# Patient Record
Sex: Male | Born: 1935 | Race: White | Hispanic: No | Marital: Single | Smoking: Never smoker
Health system: Southern US, Community
[De-identification: ages and names within clinical notes are randomized; demographics above are authoritative.]

## PROBLEM LIST (undated history)

## (undated) DIAGNOSIS — E875 Hyperkalemia: Secondary | ICD-10-CM

## (undated) DIAGNOSIS — D649 Anemia, unspecified: Secondary | ICD-10-CM

## (undated) DIAGNOSIS — N186 End stage renal disease: Secondary | ICD-10-CM

## (undated) DIAGNOSIS — F0390 Unspecified dementia without behavioral disturbance: Secondary | ICD-10-CM

---

## 2015-02-23 ENCOUNTER — Inpatient Hospital Stay
Admission: EM | Admit: 2015-02-23 | Discharge: 2015-02-25 | DRG: 682 | Disposition: A | Discharge status: Home / Self Care | Payer: Medicare Other | Attending: Internal Medicine | Admitting: Internal Medicine

## 2015-02-23 ENCOUNTER — Emergency Department: Payer: Medicare Other

## 2015-02-23 DIAGNOSIS — N184 Chronic kidney disease, stage 4 (severe): Secondary | ICD-10-CM | POA: Diagnosis present

## 2015-02-23 DIAGNOSIS — Z66 Do not resuscitate: Secondary | ICD-10-CM | POA: Diagnosis present

## 2015-02-23 DIAGNOSIS — Z79899 Other long term (current) drug therapy: Secondary | ICD-10-CM | POA: Diagnosis not present

## 2015-02-23 DIAGNOSIS — G459 Transient cerebral ischemic attack, unspecified: Secondary | ICD-10-CM | POA: Diagnosis present

## 2015-02-23 DIAGNOSIS — D649 Anemia, unspecified: Secondary | ICD-10-CM

## 2015-02-23 DIAGNOSIS — F0390 Unspecified dementia without behavioral disturbance: Secondary | ICD-10-CM | POA: Diagnosis present

## 2015-02-23 DIAGNOSIS — N179 Acute kidney failure, unspecified: Principal | ICD-10-CM | POA: Diagnosis present

## 2015-02-23 DIAGNOSIS — N281 Cyst of kidney, acquired: Secondary | ICD-10-CM | POA: Diagnosis present

## 2015-02-23 DIAGNOSIS — I10 Essential (primary) hypertension: Secondary | ICD-10-CM

## 2015-02-23 DIAGNOSIS — J811 Chronic pulmonary edema: Secondary | ICD-10-CM | POA: Diagnosis present

## 2015-02-23 DIAGNOSIS — E875 Hyperkalemia: Secondary | ICD-10-CM | POA: Diagnosis present

## 2015-02-23 DIAGNOSIS — H3530 Unspecified macular degeneration: Secondary | ICD-10-CM | POA: Diagnosis present

## 2015-02-23 DIAGNOSIS — E8770 Fluid overload, unspecified: Secondary | ICD-10-CM | POA: Diagnosis present

## 2015-02-23 DIAGNOSIS — J810 Acute pulmonary edema: Secondary | ICD-10-CM | POA: Diagnosis present

## 2015-02-23 DIAGNOSIS — I340 Nonrheumatic mitral (valve) insufficiency: Secondary | ICD-10-CM | POA: Diagnosis not present

## 2015-02-23 DIAGNOSIS — R739 Hyperglycemia, unspecified: Secondary | ICD-10-CM | POA: Diagnosis present

## 2015-02-23 DIAGNOSIS — J90 Pleural effusion, not elsewhere classified: Secondary | ICD-10-CM | POA: Diagnosis present

## 2015-02-23 DIAGNOSIS — I1310 Hypertensive heart and chronic kidney disease without heart failure, with stage 1 through stage 4 chronic kidney disease, or unspecified chronic kidney disease: Secondary | ICD-10-CM | POA: Diagnosis present

## 2015-02-23 DIAGNOSIS — Z8249 Family history of ischemic heart disease and other diseases of the circulatory system: Secondary | ICD-10-CM

## 2015-02-23 LAB — URINALYSIS COMPLETE WITH MICROSCOPIC (ARMC ONLY)
BACTERIA UA: NONE SEEN
Bilirubin Urine: NEGATIVE
Glucose, UA: NEGATIVE mg/dL
Hgb urine dipstick: NEGATIVE
KETONES UR: NEGATIVE mg/dL
Leukocytes, UA: NEGATIVE
Nitrite: NEGATIVE
PROTEIN: 30 mg/dL — AB
Specific Gravity, Urine: 1.013 (ref 1.005–1.030)
pH: 7 (ref 5.0–8.0)

## 2015-02-23 LAB — COMPREHENSIVE METABOLIC PANEL
ALK PHOS: 57 U/L (ref 38–126)
ALT: 9 U/L — AB (ref 17–63)
AST: 15 U/L (ref 15–41)
Albumin: 3.6 g/dL (ref 3.5–5.0)
Anion gap: 7 (ref 5–15)
BILIRUBIN TOTAL: 0.4 mg/dL (ref 0.3–1.2)
BUN: 38 mg/dL — ABNORMAL HIGH (ref 6–20)
CALCIUM: 8.3 mg/dL — AB (ref 8.9–10.3)
CO2: 23 mmol/L (ref 22–32)
CREATININE: 2.35 mg/dL — AB (ref 0.61–1.24)
Chloride: 111 mmol/L (ref 101–111)
GFR, EST AFRICAN AMERICAN: 25 mL/min — AB (ref >60)
GFR, EST NON AFRICAN AMERICAN: 22 mL/min — AB (ref >60)
Glucose, Bld: 100 mg/dL — ABNORMAL HIGH (ref 65–99)
Potassium: 5.5 mmol/L — ABNORMAL HIGH (ref 3.5–5.1)
Sodium: 141 mmol/L (ref 135–145)
TOTAL PROTEIN: 5.8 g/dL — AB (ref 6.5–8.1)

## 2015-02-23 LAB — LACTIC ACID, PLASMA
Lactic Acid, Venous: 1.1 mmol/L (ref 0.5–2.0)
Lactic Acid, Venous: 0.7 mmol/L (ref 0.5–2.0)

## 2015-02-23 LAB — CBC
HCT: 30.3 % — ABNORMAL LOW (ref 40.0–52.0)
Hemoglobin: 10 g/dL — ABNORMAL LOW (ref 13.0–18.0)
MCH: 33.1 pg (ref 26.0–34.0)
MCHC: 33.1 g/dL (ref 32.0–36.0)
MCV: 99.9 fL (ref 80.0–100.0)
PLATELETS: 124 10*3/uL — AB (ref 150–440)
RBC: 3.03 MIL/uL — AB (ref 4.40–5.90)
RDW: 15.4 % — ABNORMAL HIGH (ref 11.5–14.5)
WBC: 6 10*3/uL (ref 3.8–10.6)

## 2015-02-23 LAB — TROPONIN I

## 2015-02-23 MED ORDER — ACETAMINOPHEN 650 MG RE SUPP: 650.0000 mg | Freq: Four times a day (QID) | RECTAL | Status: DC | PRN

## 2015-02-23 MED ORDER — ALUM & MAG HYDROXIDE-SIMETH 200-200-20 MG/5ML PO SUSP: 30.0000 mL | Freq: Four times a day (QID) | ORAL | Status: DC | PRN

## 2015-02-23 MED ORDER — SODIUM CHLORIDE 0.9 % IV SOLN
INTRAVENOUS | Status: DC
  Administered 2015-02-23: 22:00:00 via INTRAVENOUS

## 2015-02-23 MED ORDER — ACETAMINOPHEN 325 MG PO TABS: 650.0000 mg | ORAL_TABLET | Freq: Four times a day (QID) | ORAL | Status: DC | PRN

## 2015-02-23 MED ORDER — ENOXAPARIN SODIUM 40 MG/0.4ML ~~LOC~~ SOLN: 40.0000 mg | SUBCUTANEOUS | Status: DC

## 2015-02-23 MED ORDER — SENNOSIDES-DOCUSATE SODIUM 8.6-50 MG PO TABS: 1.0000 | ORAL_TABLET | Freq: Every evening | ORAL | Status: DC | PRN

## 2015-02-23 MED ORDER — HEPARIN SODIUM (PORCINE) 5000 UNIT/ML IJ SOLN
5000.0000 [IU] | Freq: Two times a day (BID) | INTRAMUSCULAR | Status: DC
  Administered 2015-02-23 – 2015-02-25 (×4): 5000 [IU] via SUBCUTANEOUS
  Filled 2015-02-23 (×4): qty 1

## 2015-02-23 MED ORDER — OCUVITE-LUTEIN PO CAPS
1.0000 | ORAL_CAPSULE | Freq: Two times a day (BID) | ORAL | Status: DC
  Administered 2015-02-23 – 2015-02-25 (×4): 1 via ORAL
  Filled 2015-02-23 (×4): qty 1

## 2015-02-23 MED ORDER — GLUCOSAMINE-CHONDROITIN 500-400 MG PO TABS: 1.0000 | ORAL_TABLET | Freq: Every day | ORAL | Status: DC

## 2015-02-23 MED ORDER — DIPHENHYDRAMINE HCL 12.5 MG/5ML PO ELIX: 50.0000 mg | ORAL_SOLUTION | Freq: Once | ORAL | Status: DC

## 2015-02-23 NOTE — ED Provider Notes (Signed)
[facility] Emergency Department Provider Note  ____________________________________________  Time seen: Approximately 5:06 PM  I have reviewed the triage vital signs and the nursing notes.   HISTORY  Chief Complaint Altered Mental Status   HPI Candis SchatzJohn Piontek is a 81 y.o. male this afternoon patient had an episode of slurry speech and words that were not making sense. This happened between 1 and 3:00. The symptoms have completely resolved at the present time. Speech was slurry enough to be unintelligible making it severe slurry speech. Patient had no new surgery medicines or activities. Nothing made it better or worse.  History reviewed. No pertinent past medical history.  Patient has no past medical history  There are no active problems to display for this patient.   History reviewed. No pertinent past surgical history.   Patient's only surgical history significant for shrapnel wound sustained to the right leg and the second one more in the [place]  Current Outpatient Rx  Name  Route  Sig  Dispense  Refill  . glucosamine-chondroitin 500-400 MG tablet   Oral   Take 1 tablet by mouth daily.         . Multiple Vitamins-Minerals (PRESERVISION AREDS 2) CAPS   Oral   Take 1 capsule by mouth 2 (two) times daily.           Allergies Review of patient's allergies indicates no known allergies.  No family history on file.  Social History Social History  Substance Use Topics  . Smoking status: Never Smoker   . Smokeless tobacco: None  . Alcohol Use: None   patient actually reports he was a smoker 40 years ago. He currently smokes a pipe but does not inhale Review of Systems Constitutional: No fever/chills Eyes: No visual changes. ENT: No sore throat. Cardiovascular: Denies chest pain. Respiratory: Denies shortness of breath. Gastrointestinal: No abdominal pain.  No nausea, no vomiting.  No diarrhea.  No constipation. Genitourinary: Negative for  dysuria. Musculoskeletal: Negative for back pain. Skin: Negative for rash. Neurological: Negative for headaches, 10-point ROS otherwise negative.  ____________________________________________   PHYSICAL EXAM:  VITAL SIGNS: ED Triage Vitals  Enc Vitals Group     BP 02/23/15 1420 152/76 mmHg     Pulse Rate 02/23/15 1420 64     Resp 02/23/15 1420 16     Temp 02/23/15 1420 98.4 F (36.9 C)     Temp Source 02/23/15 1420 Oral     SpO2 02/23/15 1420 98 %     Weight --      Height --      Head Cir --      Peak Flow --      Pain Score 02/23/15 1448 0     Pain Loc --      Pain Edu? --      Excl. in GC? --     Constitutional: Alert and oriented. Well appearing and in no acute distress. Eyes: Conjunctivae are normal. PERRL. EOMI. Head: Atraumatic. Nose: No congestion/rhinnorhea. Mouth/Throat: Mucous membranes are moist.  Oropharynx non-erythematous. Neck: No stridor.  Cardiovascular: Normal rate, regular rhythm. Grossly normal heart sounds.  Good peripheral circulation. Respiratory: Normal respiratory effort.  No retractions. Lungs CTAB. Gastrointestinal: Soft and nontender. No distention. No abdominal bruits. No CVA tenderness. Musculoskeletal: No lower extremity tenderness nor edema.  No joint effusions. Neurologic:  Normal speech and language. No gross focal neurologic deficits are appreciated. Cranial nerves II through XII are intact cerebellar finger-nose rapid alternating movements and hands are normal  motor strength is 5 over 5 throughout sensation is intact. Skin:  Skin is warm, dry and intact. No rash noted. Psychiatric: Mood and affect are normal. Speech and behavior are normal.  ____________________________________________   LABS (all labs ordered are listed, but only abnormal results are displayed)  Labs Reviewed  COMPREHENSIVE METABOLIC PANEL - Abnormal; Notable for the following:    Potassium 5.5 (*)    Glucose, Bld 100 (*)    BUN 38 (*)    Creatinine, Ser  2.35 (*)    Calcium 8.3 (*)    Total Protein 5.8 (*)    ALT 9 (*)    GFR calc non Af Amer 22 (*)    GFR calc Af Amer 25 (*)    All other components within normal limits  CBC - Abnormal; Notable for the following:    RBC 3.03 (*)    Hemoglobin 10.0 (*)    HCT 30.3 (*)    RDW 15.4 (*)    Platelets 124 (*)    All other components within normal limits  URINALYSIS COMPLETEWITH MICROSCOPIC ([facility] ONLY) - Abnormal; Notable for the following:    Color, Urine YELLOW (*)    APPearance CLEAR (*)    Protein, ur 30 (*)    Squamous Epithelial / LPF 0-5 (*)    All other components within normal limits  TROPONIN I  LACTIC ACID, PLASMA  LACTIC ACID, PLASMA  CBC WITH DIFFERENTIAL/PLATELET  CBG MONITORING, ED   ____________________________________________  EKG  EKG shows normal sinus rhythm at a rate of 63 left axis left anterior hemiblock nonspecific ST-T wave changes with T-wave inversions in the lateral chest leads ____________________________________________  RADIOLOGY  CT showed no acute disease per radiology ____________________________________________   PROCEDURES   ____________________________________________   INITIAL IMPRESSION / ASSESSMENT AND PLAN / ED COURSE  Pertinent labs & imaging results that were available during my care of the patient were reviewed by me and considered in my medical decision making (see chart for details).   ____________________________________________   FINAL CLINICAL IMPRESSION(S) / ED DIAGNOSES  Final diagnoses:  Transient cerebral ischemia, unspecified transient cerebral ischemia type      Arnaldo Natal, MD 02/23/15 [phone]

## 2015-02-23 NOTE — Progress Notes (Signed)
PHARMACIST - PHYSICIAN ORDER COMMUNICATION  CONCERNING: P&T Medication Policy on Herbal Medications  DESCRIPTION:  This patient's order for:  Glucosamine-Chondroitin  has been noted.  This product(s) is classified as an "herbal" or natural product. Due to a lack of definitive safety studies or FDA approval, nonstandard manufacturing practices, plus the potential risk of unknown drug-drug interactions while on inpatient medications, the Pharmacy and Therapeutics Committee does not permit the use of "herbal" or natural products of this type within [facility].   ACTION TAKEN: The pharmacy department is unable to verify this order at this time and your patient has been informed of this safety policy. Please reevaluate patient's clinical condition at discharge and address if the herbal or natural product(s) should be resumed at that time.   

## 2015-02-23 NOTE — H&P (Signed)
[facility] [place] [facility]   PATIENT NAME: Mathew SchatzJohn Herring    MR#:  [id number]  DATE OF BIRTH:  01/14/1916  DATE OF ADMISSION:  02/23/2015  PRIMARY CARE PHYSICIAN: No primary care provider on file.   REQUESTING/REFERRING PHYSICIAN: Dr Darnelle CatalanMalinda  CHIEF COMPLAINT:  I felt dizzy and weak  HISTORY OF PRESENT ILLNESS:  Mathew Herring  is a 81 y.o. male with no past medical history: Lives at home with a caregiver comes to the emergency room after he felt dizzy lightheaded today. He was brought to the emergency room because he was feeling very weak according to caregiver. He was found to have creatinine of 2.35. Baseline labs are not available. Patient reports poor by mouth intake for last several days. Denies any cough abdominal pain chest pain shortness of breath or any home foul-smelling urine.  CT of the head is negative for stroke. Patient is being admitted for acute renal failure.  PAST MEDICAL HISTORY:  History reviewed. No pertinent past medical history.  PAST SURGICAL HISTOIRY:  History reviewed. No pertinent past surgical history.  SOCIAL HISTORY:   Social History  Substance Use Topics  . Smoking status: Never Smoker   . Smokeless tobacco: Not on file  . Alcohol Use: Not on file    FAMILY HISTORY:  HTN  DRUG ALLERGIES:  No Known Allergies  REVIEW OF SYSTEMS:  Review of Systems  Constitutional: Positive for malaise/fatigue. Negative for fever, chills and diaphoresis.  HENT: Negative for congestion, ear pain, hearing loss, nosebleeds and sore throat.   Eyes: Negative for blurred vision, double vision, photophobia and pain.  Respiratory: Negative for hemoptysis, sputum production, wheezing and stridor.   Cardiovascular: Negative for orthopnea, claudication and leg swelling.  Gastrointestinal: Negative for heartburn and abdominal pain.  Genitourinary: Negative for dysuria and frequency.  Musculoskeletal: Negative for back pain, joint pain and  neck pain.  Skin: Negative for rash.  Neurological: Positive for dizziness and weakness. Negative for tingling, sensory change, speech change, focal weakness, seizures and headaches.  Endo/Heme/Allergies: Does not bruise/bleed easily.  Psychiatric/Behavioral: Negative for suicidal ideas, memory loss and substance abuse. The patient is not nervous/anxious.      MEDICATIONS AT HOME:   Prior to Admission medications   Medication Sig Start Date End Date Taking? Authorizing Provider  glucosamine-chondroitin 500-400 MG tablet Take 1 tablet by mouth daily.   Yes Historical Provider, MD  Multiple Vitamins-Minerals (PRESERVISION AREDS 2) CAPS Take 1 capsule by mouth 2 (two) times daily.   Yes Historical Provider, MD      VITAL SIGNS:  Blood pressure 159/75, pulse 57, temperature 98.4 F (36.9 C), temperature source Oral, resp. rate 20, SpO2 99 %.  PHYSICAL EXAMINATION:  GENERAL:  81 y.o.-year-old patient lying in the bed with no acute distress.  EYES: Pupils equal, round, reactive to light and accommodation. No scleral icterus. Extraocular muscles intact.  HEENT: Head atraumatic, normocephalic. Oropharynx and nasopharynx clear.  NECK:  Supple, no jugular venous distention. No thyroid enlargement, no tenderness.  LUNGS: Normal breath sounds bilaterally, no wheezing, rales,rhonchi or crepitation. No use of accessory muscles of respiration.  CARDIOVASCULAR: S1, S2 normal. No murmurs, rubs, or gallops.  ABDOMEN: Soft, nontender, nondistended. Bowel sounds present. No organomegaly or mass.  EXTREMITIES: No pedal edema, cyanosis, or clubbing.  NEUROLOGIC: Cranial nerves II through XII are intact. Muscle strength 4/5 in all extremities. Sensation intact. Gait not checked.overall weak  PSYCHIATRIC:  patient is alert and oriented x 2.  SKIN: No  obvious rash, lesion, or ulcer.   LABORATORY PANEL:   CBC  Recent Labs Lab 02/23/15 1452  WBC 6.0  HGB 10.0*  HCT 30.3*  PLT 124*    ------------------------------------------------------------------------------------------------------------------  Chemistries   Recent Labs Lab 02/23/15 1452  NA 141  K 5.5*  CL 111  CO2 23  GLUCOSE 100*  BUN 38*  CREATININE 2.35*  CALCIUM 8.3*  AST 15  ALT 9*  ALKPHOS 57  BILITOT 0.4   ------------------------------------------------------------------------------------------------------------------  Cardiac Enzymes  Recent Labs Lab 02/23/15 1452  TROPONINI <0.03   ------------------------------------------------------------------------------------------------------------------  RADIOLOGY:  Dg Chest 2 View  02/23/2015  CLINICAL DATA:  Altered mental status EXAM: CHEST  2 VIEW COMPARISON:  None. FINDINGS: Interstitial coarsening which appears subpleural and is associated with mildly low lung volumes. No gross honeycombing. There is no edema, consolidation, effusion, or pneumothorax. Mild cardiomegaly. Negative aortic and hilar contours. IMPRESSION: No pneumonia or edema. Suspect interstitial lung disease. Electronically Signed   By: Marnee Spring M.D.   On: 02/23/2015 16:31   Ct Head Wo Contrast  02/23/2015  CLINICAL DATA:  Patient with altered mental status.  Dizziness. EXAM: CT HEAD WITHOUT CONTRAST TECHNIQUE: Contiguous axial images were obtained from the base of the skull through the vertex without intravenous contrast. COMPARISON:  None. FINDINGS: Ventricles and sulci are prominent compatible with atrophy. Periventricular and subcortical white matter hypodensity compatible with chronic small vessel ischemic changes. Left basal ganglia calcifications. Focal area of hypodensity within the left occipital lobe (image 16; series 2), most compatible with chronic infarct. No evidence for acute intracranial hemorrhage or mass lesion. Calvarium is intact. Paranasal sinuses are unremarkable. Mastoid air cells are well aerated. IMPRESSION: No acute intracranial process. 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 Cortical atrophy and chronic small vessel ischemic changes. Electronically Signed   By: Annia Belt M.D.   On: 02/23/2015 16:24    EKG:   NSR, LAD IMPRESSION AND PLAN:   81 y.o. male with no past medical history: Lives at home with a caregiver comes to the emergency room after he felt dizzy lightheaded today.  1. Acute renal failure Presented with dizziness and weakness -creat 2.35 (baseline unknown) -IVF for hydration - I and O and avoid nephrotoxins  2.Elevated BP w/o h/o HTN Prn hydralazine -po asa  3. gen weakness PT to see  4. H/o Macular degeneration  5.DVT prophylaxis lovenox    All the records are reviewed and case discussed with ED provider. Management plans discussed with the patient, family and they are in agreement.  CODE STATUS: DNR (per dter Gigi Gin sue)  Celine Mans no [phone]  TOTAL TIME TAKING CARE OF THIS PATIENT: 50 minutes.    Nautia Lem M.D on 02/23/2015 at 8:11 PM  Between 7am to 6pm - Pager - [phone]  After 6pm go to [email or web address] - password [email or web address] [facility]  [facility]  Office  [phone]  CC: Primary care physician; No primary care provider on file.

## 2015-02-23 NOTE — ED Notes (Signed)
Patient resting in stretcher. Respirations even and unlabored. No obvious distress. Cardiac monitor in place. No needs/concerns verbalized at this time. Call bell within reach. Encouraged to call with needs. Will continue to monitor. 

## 2015-02-23 NOTE — Progress Notes (Addendum)
ANTICOAGULATION CONSULT NOTE - Initial Consult  Pharmacy Consult for Lovenox/Heparin Indication: VTE prophylaxis  No Known Allergies  Patient Measurements: Height: 5\' 10"  (177.8 cm) Weight: 127 lb 4.8 oz (57.743 kg) IBW/kg (Calculated) : 73 Heparin Dosing Weight:   Vital Signs: Temp: 98.5 F (36.9 C) (10/18 2112) Temp Source: Oral (10/18 2112) BP: 169/74 mmHg (10/18 2112) Pulse Rate: 57 (10/18 2112)  Labs:  Recent Labs  02/23/15 1452  HGB 10.0*  HCT 30.3*  PLT 124*  CREATININE 2.35*  TROPONINI <0.03    Estimated Creatinine Clearance: 14.7 mL/min (by C-G formula based on Cr of 2.35).   Medical History: History reviewed. No pertinent past medical history.  Medications:  Prescriptions prior to admission  Medication Sig Dispense Refill Last Dose  . glucosamine-chondroitin 500-400 MG tablet Take 1 tablet by mouth daily.   02/22/2015 at Unknown time  . Multiple Vitamins-Minerals (PRESERVISION AREDS 2) CAPS Take 1 capsule by mouth 2 (two) times daily.   02/22/2015 at Unknown time    Assessment: CrCl = 14.7 ml/min  Goal of Therapy:  DVT prophylaxis   Plan:  Lovenox 40 mg SQ Q24H originally ordered.  Will adjust to Heparin 5000 units SQ Q12H based on CrCl < 15 ml/min.  Prinston Kynard D 02/23/2015,10:20 PM

## 2015-02-23 NOTE — ED Notes (Signed)
Pt bib EMS from home w/ c/o alt mental status.  Per EMS, pt was experiencing dizziness this AM, per caregiver pt was not acting normal.  Pt denies weakness, dizziness and pain at this time.

## 2015-02-24 ENCOUNTER — Inpatient Hospital Stay: Payer: Medicare Other

## 2015-02-24 LAB — CBC
HCT: 27.5 % — ABNORMAL LOW (ref 40.0–52.0)
HEMOGLOBIN: 9 g/dL — AB (ref 13.0–18.0)
MCH: 32.1 pg (ref 26.0–34.0)
MCHC: 32.6 g/dL (ref 32.0–36.0)
MCV: 98.5 fL (ref 80.0–100.0)
PLATELETS: 117 10*3/uL — AB (ref 150–440)
RBC: 2.79 MIL/uL — AB (ref 4.40–5.90)
RDW: 15 % — ABNORMAL HIGH (ref 11.5–14.5)
WBC: 5.7 10*3/uL (ref 3.8–10.6)

## 2015-02-24 LAB — CREATININE, SERUM
Creatinine, Ser: 2.41 mg/dL — ABNORMAL HIGH (ref 0.61–1.24)
GFR calc non Af Amer: 21 mL/min — ABNORMAL LOW (ref >60)
GFR, EST AFRICAN AMERICAN: 24 mL/min — AB (ref >60)

## 2015-02-24 LAB — BASIC METABOLIC PANEL
Anion gap: 6 (ref 5–15)
BUN: 43 mg/dL — AB (ref 6–20)
CO2: 22 mmol/L (ref 22–32)
CREATININE: 2.33 mg/dL — AB (ref 0.61–1.24)
Calcium: 8 mg/dL — ABNORMAL LOW (ref 8.9–10.3)
Chloride: 113 mmol/L — ABNORMAL HIGH (ref 101–111)
GFR, EST AFRICAN AMERICAN: 25 mL/min — AB (ref >60)
GFR, EST NON AFRICAN AMERICAN: 22 mL/min — AB (ref >60)
Glucose, Bld: 115 mg/dL — ABNORMAL HIGH (ref 65–99)
POTASSIUM: 5 mmol/L (ref 3.5–5.1)
SODIUM: 141 mmol/L (ref 135–145)

## 2015-02-24 MED ORDER — AMLODIPINE BESYLATE 5 MG PO TABS
5.0000 mg | ORAL_TABLET | Freq: Every day | ORAL | Status: DC
  Administered 2015-02-24 – 2015-02-25 (×2): 5 mg via ORAL
  Filled 2015-02-24 (×2): qty 1

## 2015-02-24 NOTE — Progress Notes (Signed)
Dr.Vaickute here to see patient- discussing plan of care.

## 2015-02-24 NOTE — Evaluation (Signed)
Physical Therapy Evaluation Patient Details Name: Mathew Herring MRN: [id number] DOB: 1916-03-02 Today's Date: 02/24/2015   History of Present Illness  Pt is a 81 y.o. male presenting to ED with dizziness and weakness.  Pt admitted with acute renal failure.  CT head negative for acute intracranial findings but showed likely chronic L occipital lobe infarct.  Clinical Impression  Currently pt demonstrates impairments with general strength, balance, and limitations with functional mobility.  Prior to admission, pt was independent with ambulation (sometimes using SPC), neighbors assisted with meals, and pt had caregiver a few times a week for housework.  Pt lives alone in 1 level home with 2 plus 1 step to enter without railings.  Currently pt is CGA with transfers and gait with RW (no loss of balance noted during session); pt required vc's to stay inside RW with turns and ambulation though.  Pt's friend reports pt's daughter is coming into town today to stay for a little bit;  Pt would benefit from skilled PT to address above noted impairments and functional limitations.  Recommend pt discharge to home with SBA for all functional mobility for safety initially; also recommend HHPT.     Follow Up Recommendations Home health PT;Supervision for mobility/OOB    Equipment Recommendations       Recommendations for Other Services       Precautions / Restrictions Precautions Precautions: Fall Restrictions Weight Bearing Restrictions: No      Mobility  Bed Mobility Overal bed mobility: Needs Assistance Bed Mobility: Supine to Sit     Supine to sit: Supervision     General bed mobility comments: HOB elevated  Transfers Overall transfer level: Needs assistance Equipment used: Rolling walker (2 wheeled) Transfers: Sit to/from Stand Sit to Stand: Min guard         General transfer comment: good strong stand without loss of balance  Ambulation/Gait Ambulation/Gait assistance: Min  guard Ambulation Distance (Feet): 190 Feet Assistive device: Rolling walker (2 wheeled)     Gait velocity interpretation: at or above normal speed for age/gender General Gait Details: step through gait; pt required vc's to stay within RW with turns and walking intermittently; pt with forward posture and pushing RW too far in front of him requiring vc's for safety with gait  Stairs            Wheelchair Mobility    Modified Rankin (Stroke Patients Only)       Balance Overall balance assessment: Needs assistance Sitting-balance support: Bilateral upper extremity supported;Feet supported Sitting balance-Leahy Scale: Good     Standing balance support: Bilateral upper extremity supported (on RW) Standing balance-Leahy Scale: Good                               Pertinent Vitals/Pain Pain Assessment: No/denies pain  Vitals stable and WFL throughout treatment session.    Home Living Family/patient expects to be discharged to:: Private residence Living Arrangements: Alone Available Help at Discharge: Friend(s);Family (Has caregiver)   Home Access: Stairs to enter Entrance Stairs-Rails: None Entrance Stairs-Number of Steps: 2 plus 1  Home Layout: One level Home Equipment: Walker - 2 wheels;Cane - single point      Prior Function Level of Independence: Needs assistance   Gait / Transfers Assistance Needed: Pt initially reporting using RW at home but pt's friend reports pt will sometimes use SPC, otherwise does not use anything.  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  Pt agreeable to PT session.  Pt's nephew and friend present during session.    Exercises        Assessment/Plan    PT Assessment Patient needs continued PT services  PT Diagnosis Generalized weakness   PT Problem List Decreased strength;Decreased balance;Decreased mobility;Decreased knowledge of use of DME  PT Treatment Interventions DME instruction;Gait training;Stair training;Functional mobility training;Therapeutic activities;Therapeutic exercise;Balance training;Patient/family education   PT Goals (Current goals can be found in the Care Plan section) Acute Rehab PT Goals Patient Stated Goal: to go home PT Goal Formulation: With patient Time For Goal Achievement: 03/10/15 Potential to Achieve Goals: Good    Frequency Min 2X/week   Barriers to discharge        Co-evaluation               End of Session Equipment Utilized During Treatment: Gait belt Activity Tolerance: Patient tolerated treatment well Patient left: in chair;with call bell/phone within reach;with chair alarm set;with family/visitor present;with SCD's reapplied Nurse Communication: Mobility status         Time: [id number] PT Time Calculation (min) (ACUTE ONLY): 26 min   Charges:   PT Evaluation $Initial PT Evaluation Tier I: 1 Procedure PT Treatments $Therapeutic Activity: 8-22 mins   PT G CodesHendricks Limes:        Talaysia Pinheiro 02/24/2015, 10:49 AM Hendricks LimesEmily Aileene Lanum, PT [phone]

## 2015-02-24 NOTE — Plan of Care (Signed)
Problem: Phase I Progression Outcomes Goal: OOB as tolerated unless otherwise ordered Outcome: Progressing Patient OOB with one person assist

## 2015-02-24 NOTE — Progress Notes (Signed)
Patient's up and ambulating with PT.

## 2015-02-24 NOTE — Progress Notes (Signed)
[facility] - [place] at [facility]   PATIENT NAME: Mathew Herring    MR#:  [id number]  DATE OF BIRTH:  1915/11/04  SUBJECTIVE:  CHIEF COMPLAINT:   Chief Complaint  Patient presents with  . Altered Mental Status   patient is 81 year old Caucasian male was no significant past medical history who presents to the hospital with swimming headedness and weakness. In the emergency room he was noted to be hypertensive with systolic blood pressure agent from 140s to 170s. His labs revealed renal failure. Patient was given IV fluids was no significant improvement of his kidney function. Bladder was scanned and it did not show retention. Ultrasound of kidneys as well as nephrology consultation is pending. Patient denies using nonsteroidal anti-inflammatory medications at home.   Review of Systems  Constitutional: Negative for fever, chills and weight loss.  HENT: Negative for congestion.   Eyes: Negative for blurred vision and double vision.  Respiratory: Negative for cough, sputum production, shortness of breath and wheezing.   Cardiovascular: Negative for chest pain, palpitations, orthopnea, leg swelling and PND.  Gastrointestinal: Negative for nausea, vomiting, abdominal pain, diarrhea, constipation and blood in stool.  Genitourinary: Negative for dysuria, urgency, frequency and hematuria.  Musculoskeletal: Negative for falls.  Neurological: Negative for dizziness, tremors, focal weakness and headaches.  Endo/Heme/Allergies: Does not bruise/bleed easily.  Psychiatric/Behavioral: Negative for depression. The patient does not have insomnia.     VITAL SIGNS: Blood pressure 131/67, pulse 59, temperature 98.2 F (36.8 C), temperature source Oral, resp. rate 16, height  (1.778 m), weight 57.743 kg (127 lb 4.8 oz), SpO2 99 %.  PHYSICAL EXAMINATION:   GENERAL:  81 y.o.-year-old patient lying in the bed with no acute distress.  EYES: Pupils equal, round, reactive to light  and accommodation. No scleral icterus. Extraocular muscles intact.  HEENT: Head atraumatic, normocephalic. Oropharynx and nasopharynx clear.  NECK:  Supple, no jugular venous distention. No thyroid enlargement, no tenderness.  LUNGS: Normal breath sounds bilaterally, no wheezing, rales,rhonchi or crepitation. No use of accessory muscles of respiration.  CARDIOVASCULAR: S1, S2 normal. No murmurs, rubs, or gallops.  ABDOMEN: Soft, nontender, nondistended. Bowel sounds present. No organomegaly or mass.  EXTREMITIES: No pedal edema, cyanosis, or clubbing.  NEUROLOGIC: Cranial nerves II through XII are intact. Muscle strength 5/5 in all extremities. Sensation intact. Gait not checked.  PSYCHIATRIC: The patient is alert and oriented x 3.  SKIN: No obvious rash, lesion, or ulcer.   ORDERS/RESULTS REVIEWED:   CBC  Recent Labs Lab 02/23/15 1452 02/24/15 0019  WBC 6.0 5.7  HGB 10.0* 9.0*  HCT 30.3* 27.5*  PLT 124* 117*  MCV 99.9 98.5  MCH 33.1 32.1  MCHC 33.1 32.6  RDW 15.4* 15.0*   ------------------------------------------------------------------------------------------------------------------  Chemistries   Recent Labs Lab 02/23/15 1452 02/24/15 0019 02/24/15 0641  NA 141  --  141  K 5.5*  --  5.0  CL 111  --  113*  CO2 23  --  22  GLUCOSE 100*  --  115*  BUN 38*  --  43*  CREATININE 2.35* 2.41* 2.33*  CALCIUM 8.3*  --  8.0*  AST 15  --   --   ALT 9*  --   --   ALKPHOS 57  --   --   BILITOT 0.4  --   --    ------------------------------------------------------------------------------------------------------------------ estimated creatinine clearance is 14.8 mL/min (by C-G formula based on Cr of 2.33). ------------------------------------------------------------------------------------------------------------------ No results for input(s): TSH, T4TOTAL, T3FREE, THYROIDAB  in the last 72 hours.  Invalid input(s): FREET3  Cardiac Enzymes  Recent Labs Lab  02/23/15 1452  TROPONINI <0.03   ------------------------------------------------------------------------------------------------------------------ Invalid input(s): POCBNP ---------------------------------------------------------------------------------------------------------------  RADIOLOGY: Dg Chest 2 View  02/23/2015  CLINICAL DATA:  Altered mental status EXAM: CHEST  2 VIEW COMPARISON:  None. FINDINGS: Interstitial coarsening which appears subpleural and is associated with mildly low lung volumes. No gross honeycombing. There is no edema, consolidation, effusion, or pneumothorax. Mild cardiomegaly. Negative aortic and hilar contours. IMPRESSION: No pneumonia or edema. Suspect interstitial lung disease. Electronically Signed   By: Marnee SpringJonathon  Watts M.D.   On: 02/23/2015 16:31   Ct Head Wo Contrast  02/23/2015  CLINICAL DATA:  Patient with altered mental status.  Dizziness. EXAM: CT HEAD WITHOUT CONTRAST TECHNIQUE: Contiguous axial images were obtained from the base of the skull through the vertex without intravenous contrast. COMPARISON:  None. FINDINGS: Ventricles and sulci are prominent compatible with atrophy. Periventricular and subcortical white matter hypodensity compatible with chronic small vessel ischemic changes. Left basal ganglia calcifications. Focal area of hypodensity within the left occipital lobe (image 16; series 2), most compatible with chronic infarct. No evidence for acute intracranial hemorrhage or mass lesion. Calvarium is intact. Paranasal sinuses are unremarkable. Mastoid air cells are well aerated. IMPRESSION: No acute intracranial process. 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 Cortical atrophy and chronic small vessel ischemic changes. Electronically Signed   By: Annia Beltrew  Davis M.D.   On: 02/23/2015 16:24    EKG:  Orders placed or performed during the hospital encounter of 02/23/15  . ED EKG  . ED EKG    ASSESSMENT AND PLAN:  Active Problems:   Acute renal  failure (ARF) (HCC) 1. Renal failure unclear if this acute or chronic, no improvement with IV fluid administration, no obvious urinary retention, ultrasound of kidneys is pending, nephrology consultation is obtained, follow kidney function tomorrow morning. No UTI. Check him for possible diabetes 2. Hyperkalemia , resolved with therapy. 3. Hyperglycemia. Hemoglobin A1c 4. Anemia get guaiac 5. Hypertension initiate patient on Norvasc  Management plans discussed with the patient, family and they are in agreement.   DRUG ALLERGIES: No Known Allergies  CODE STATUS:     Code Status Orders        Start     Ordered   02/23/15 2137  Full code   Continuous     02/23/15 2136      TOTAL TIME TAKING CARE OF THIS PATIENT: 40 minutes.    Katharina CaperVAICKUTE,Zeriah Baysinger M.D on 02/24/2015 at 1:39 PM  Between 7am to 6pm - Pager - [phone]  After 6pm go to [email or web address] - password [email or web address] [email or web address]  [facility]  Office  [phone]  CC: Primary care physician; No primary care provider on file.

## 2015-02-24 NOTE — Progress Notes (Signed)
Pt admitted during evening and inquired about his wallet on admission. Pt had clothing, slippers, eye glasses but no wallet in belonging bag from ED. Pt not sure if caregiver took it back home and was unsure of caregiver's name to follow up. Will follow up with daughter in the morning who [place] in [place].

## 2015-02-24 NOTE — Progress Notes (Signed)
Pt continues to remove his own telemetry and pulse ox probe.  Dr Winona LegatoVaickute said to discontinue telemetry and continious pulse ox

## 2015-02-24 NOTE — Plan of Care (Signed)
Problem: Phase I Progression Outcomes Goal: Dyspnea controlled at rest Outcome: Completed/Met Date Met:  02/24/15 No dyspnea noted on assessment/admission.

## 2015-02-24 NOTE — Care Management Note (Signed)
Case Management Note  Patient Details  Name: Mathew Herring MRN: [id number] Date of Birth: 1916-04-05  Subjective/Objective:                  Met with patient to discuss planning. He states he lives alone and his daughter pays his utility bills- she lives in [place]. There is no contact number on file for daughter and patient does not know what it is.He states he walks with a walker. He denies having a PCP or taking any home Rx. He states the only thing he uses is an "eye drop to help him see". Said he's "never been sick". He states he "probably don't drink enough water but has access to water". He depends on his neighbors for transportation. The contact number he gave me was the same number that is on his facesheet. I left message at that number to call RNCM. Patient could not give me a neighbor's name.   Action/Plan: List of home health agencies left with patient along with my contact card.   Expected Discharge Date:                  Expected Discharge Plan:     In-House Referral:     Discharge planning Services  CM Consult  Post Acute Care Choice:  Home Health Choice offered to:  Patient  DME Arranged:    DME Agency:     HH Arranged:    [place] Agency:     Status of Service:     Medicare Important Message Given:    Date Medicare IM Given:    Medicare IM give by:    Date Additional Medicare IM Given:    Additional Medicare Important Message give by:     If discussed at [place] of Stay Meetings, dates discussed:    Additional Comments:  Moustapha Tooker, RN 02/24/2015, 10:07 AM

## 2015-02-24 NOTE — Progress Notes (Signed)
Initial Nutrition Assessment   INTERVENTION:   Meals and Snacks: Cater to patient preferences Medical Food Supplement Therapy: will send Magic [employer] on lunch and dinner trays   NUTRITION DIAGNOSIS:   Inadequate oral intake related to poor appetite as evidenced by per patient/family report.  GOAL:   Patient will meet greater than or equal to 90% of their needs  MONITOR:    (Energy Intake, Electrolyte and Renal Profile, Anthropometrics)  REASON FOR ASSESSMENT:   Malnutrition Screening Tool, Diagnosis    ASSESSMENT:   Pt admitted with dizziness and weakness secondary to acute renal failure. Per MD note pt with poor po intake several days PTA.  History reviewed. No pertinent past medical history.   Diet Order:  DIET SOFT Room service appropriate?: Yes; Fluid consistency:: Thin    Current Nutrition: Pt at 100% of chicken salad sandwich and fruit with 25% of mashed potatoes and green beans on visit this afternoon.  Food/Nutrition-Related History:  Pt reports good appetite PTA usually eating 2 meals per day, however per MD note pt with reduced po intake for the past several days PTA.   Scheduled Medications:  . amLODipine  5 mg Oral Daily  . heparin subcutaneous  5,000 Units Subcutaneous Q12H  . multivitamin-lutein  1 capsule Oral BID    Continuous Medications:  . sodium chloride 75 mL/hr at 02/23/15 2220     Electrolyte/Renal Profile and Glucose Profile:   Recent Labs Lab 02/23/15 1452 02/24/15 0019 02/24/15 0641  NA 141  --  141  K 5.5*  --  5.0  CL 111  --  113*  CO2 23  --  22  BUN 38*  --  43*  CREATININE 2.35* 2.41* 2.33*  CALCIUM 8.3*  --  8.0*  GLUCOSE 100*  --  115*   Protein Profile:  Recent Labs Lab 02/23/15 1452  ALBUMIN 3.6    Gastrointestinal Profile: Last BM:   Nutrition-Focused Physical Exam Findings:  Unable to complete Nutrition-Focused physical exam at this time.    Weight Change: Pt does not know current weight trend per  report.   Height:   Ht Readings from Last 1 Encounters:  02/23/15 5\' 10"  (1.778 m)    Weight:   Wt Readings from Last 1 Encounters:  02/23/15 127 lb 4.8 oz (57.743 kg)     BMI:  Body mass index is 18.27 kg/(m^2).  Estimated Nutritional Needs:   Kcal:  BEE: 1376kcals, TEE: (IF 1.1-1.3)(AF 1.2) 1816-2147kcals, using IBW of 74kg  Protein:  60-75g protein (0.8-1.0g/kg)   Fluid:  1875-222350mL of fluid (25-1930mL/kg)  EDUCATION NEEDS:   Education needs no appropriate at this time   MODERATE Care Level  Leda QuailAllyson Holly Pring, RD, LDN Pager [phone]

## 2015-02-25 ENCOUNTER — Inpatient Hospital Stay: Admit: 2015-02-25 | Payer: Medicare Other

## 2015-02-25 ENCOUNTER — Inpatient Hospital Stay (HOSPITAL_COMMUNITY)
Admit: 2015-02-25 | Discharge: 2015-02-25 | Disposition: A | Discharge status: Home / Self Care | Payer: Medicare Other | Attending: Internal Medicine | Admitting: Internal Medicine

## 2015-02-25 DIAGNOSIS — E875 Hyperkalemia: Secondary | ICD-10-CM

## 2015-02-25 DIAGNOSIS — I10 Essential (primary) hypertension: Secondary | ICD-10-CM

## 2015-02-25 DIAGNOSIS — D649 Anemia, unspecified: Secondary | ICD-10-CM

## 2015-02-25 DIAGNOSIS — I340 Nonrheumatic mitral (valve) insufficiency: Secondary | ICD-10-CM

## 2015-02-25 DIAGNOSIS — R739 Hyperglycemia, unspecified: Secondary | ICD-10-CM

## 2015-02-25 LAB — BASIC METABOLIC PANEL
ANION GAP: 3 — AB (ref 5–15)
BUN: 42 mg/dL — AB (ref 6–20)
CHLORIDE: 114 mmol/L — AB (ref 101–111)
CO2: 22 mmol/L (ref 22–32)
Calcium: 8 mg/dL — ABNORMAL LOW (ref 8.9–10.3)
Creatinine, Ser: 2.14 mg/dL — ABNORMAL HIGH (ref 0.61–1.24)
GFR calc Af Amer: 28 mL/min — ABNORMAL LOW (ref >60)
GFR calc non Af Amer: 24 mL/min — ABNORMAL LOW (ref >60)
GLUCOSE: 106 mg/dL — AB (ref 65–99)
POTASSIUM: 4.6 mmol/L (ref 3.5–5.1)
SODIUM: 139 mmol/L (ref 135–145)

## 2015-02-25 LAB — HEMOGLOBIN A1C: Hgb A1c MFr Bld: 5.4 % (ref 4.0–6.0)

## 2015-02-25 LAB — OCCULT BLOOD X 1 CARD TO LAB, STOOL: Fecal Occult Bld: NEGATIVE

## 2015-02-25 MED ORDER — HALOPERIDOL 0.5 MG PO TABS
0.5000 mg | ORAL_TABLET | Freq: Two times a day (BID) | ORAL | Status: DC
  Filled 2015-02-25 (×2): qty 1

## 2015-02-25 MED ORDER — AMLODIPINE BESYLATE 5 MG PO TABS: 5.0000 mg | ORAL_TABLET | Freq: Every day | ORAL | Status: AC

## 2015-02-25 NOTE — Discharge Summary (Signed)
[facility] - [place] at [facility]   PATIENT NAME: Mathew SchatzJohn Herring    MR#:  [id number]  DATE OF BIRTH:  05/09/1915  DATE OF ADMISSION:  02/23/2015 ADMITTING PHYSICIAN: Enedina FinnerSona Patel, MD  DATE OF DISCHARGE: No discharge date for patient encounter.  PRIMARY CARE PHYSICIAN: No primary care provider on file.     ADMISSION DIAGNOSIS:  Transient cerebral ischemia, unspecified transient cerebral ischemia type [G45.9]  DISCHARGE DIAGNOSIS:  Principal Problem:   Chronic kidney disease (CKD), stage IV (severe) (HCC) Active Problems:   Hyperkalemia   Essential hypertension   Hyperglycemia   Anemia   SECONDARY DIAGNOSIS:  History reviewed. No pertinent past medical history.  .pro HOSPITAL COURSE:  patient is 81 year old Caucasian male was no significant past medical history who presents to the hospital with swimming headedness and weakness. In the emergency room he was noted to be hypertensive with systolic blood pressure agent from 140s to 170s. His labs revealed renal failure. Patient was given IV fluids was no significant improvement of his kidney function. Bladder was scanned and it did not show retention. Ultrasound of kidneys was performed and it showed renal cysts but no obstruction or hydronephrosis , Nephrology consultation was obtained. Patient denied using nonsteroidal anti-inflammatory medications at home, he was not on any medications at home. Urinalysis was unremarkable. Patient was seen by physical therapist ambulated and felt that he would benefit from home health physical therapy, for which patient was agreeable Discussion by problem: 1. Chronic renal failure, CK D stage IV per nephrology , minimal if any improvement in creatinine level with IV fluid administration, no urinary retention, ultrasound of kidneys revealed bilateral renal cysts and the right pleural effusion, nephrology consultation is appreciated, patient is to follow-up with Dr. Thedore MinsSingh as  outpatient, following kidney function closely. No UTI. Hemoglobin A1c 5.4. No diabetes. Discharge patient home after a UPEP and SPEP studies at taken.  2. Hyperkalemia , resolved with therapy. 3. Hyperglycemia. Hemoglobin A1c was 5.4, no diabetes 4. Anemia get guaiac 5. Hypertension, continue Norvasc, improved blood pressure readings 6. Pleural effusion likely mild pulmonary edema related, due to hypertension as well as renal failure, patient would benefit from echocardiogram done as outpatient, now he is off IV fluids 7. Generalized weakness. Discharge patient home with home health, according to physical therapist recommendations, discussed with care management 8. Confusion, likely mild delirium in the setting of underlying dementia syndrome, resolving.   DISCHARGE CONDITIONS:   Fair  CONSULTS OBTAINED:  Treatment Team:  Mosetta PigeonHarmeet Singh, MD  DRUG ALLERGIES:  No Known Allergies  DISCHARGE MEDICATIONS:   Current Discharge Medication List    START taking these medications   Details  amLODipine (NORVASC) 5 MG tablet Take 1 tablet (5 mg total) by mouth daily. Qty: 30 tablet, Refills: 6      CONTINUE these medications which have NOT CHANGED   Details  glucosamine-chondroitin 500-400 MG tablet Take 1 tablet by mouth daily.    Multiple Vitamins-Minerals (PRESERVISION AREDS 2) CAPS Take 1 capsule by mouth 2 (two) times daily.         DISCHARGE INSTRUCTIONS:    Patient is to follow-up with Dr. Thedore MinsSingh as outpatient  If you experience worsening of your admission symptoms, develop shortness of breath, life threatening emergency, suicidal or homicidal thoughts you must seek medical attention immediately by calling 911 or calling your MD immediately  if symptoms less severe.  You Must read complete instructions/literature along with all the possible adverse reactions/side effects for all the  Medicines you take and that have been prescribed to you. Take any new Medicines after you have  completely understood and accept all the possible adverse reactions/side effects.   Please note  You were cared for by a hospitalist during your hospital stay. If you have any questions about your discharge medications or the care you received while you were in the hospital after you are discharged, you can call the unit and asked to speak with the hospitalist on call if the hospitalist that took care of you is not available. Once you are discharged, your primary care physician will handle any further medical issues. Please note that NO REFILLS for any discharge medications will be authorized once you are discharged, as it is imperative that you return to your primary care physician (or establish a relationship with a primary care physician if you do not have one) for your aftercare needs so that they can reassess your need for medications and monitor your lab values.    Today   CHIEF COMPLAINT:   Chief Complaint  Patient presents with  . Altered Mental Status    HISTORY OF PRESENT ILLNESS:  Mathew Herring  is a 81 y.o. male with no significant past medical history who presents to the hospital with swimming headedness and weakness. In the emergency room he was noted to be hypertensive with systolic blood pressure agent from 140s to 170s. His labs revealed renal failure. Patient was given IV fluids was no significant improvement of his kidney function. Bladder was scanned and it did not show retention. Ultrasound of kidneys was performed and it showed renal cysts but no obstruction or hydronephrosis , Nephrology consultation was obtained. Patient denied using nonsteroidal anti-inflammatory medications at home, he was not on any medications at home. Urinalysis was unremarkable. Patient was seen by physical therapist ambulated and felt that he would benefit from home health physical therapy, for which patient was agreeable Discussion by problem: 1. Chronic renal failure, CK D stage IV per nephrology ,  minimal if any improvement in creatinine level with IV fluid administration, no urinary retention, ultrasound of kidneys revealed bilateral renal cysts and the right pleural effusion, nephrology consultation is appreciated, patient is to follow-up with Dr. Thedore Mins as outpatient, following kidney function closely. No UTI. Hemoglobin A1c 5.4. No diabetes. Discharge patient home after a UPEP and SPEP studies at taken.  2. Hyperkalemia , resolved with therapy. 3. Hyperglycemia. Hemoglobin A1c was 5.4, no diabetes 4. Anemia get guaiac 5. Hypertension, continue Norvasc, improved blood pressure readings 6. Pleural effusion likely mild pulmonary edema related, due to hypertension as well as renal failure, patient would benefit from echocardiogram done as outpatient, now he is off IV fluids 7. Generalized weakness. Discharge patient home with home health, according to physical therapist recommendations, discussed with care management 8. Confusion, likely mild delirium in the setting of underlying dementia syndrome, resolving.   VITAL SIGNS:  Blood pressure 134/64, pulse 61, temperature 97.8 F (36.6 C), temperature source Oral, resp. rate 18, height  (1.778 m), weight 57.743 kg (127 lb 4.8 oz), SpO2 99 %.  I/O:   Intake/Output Summary (Last 24 hours) at 02/25/15 1356 Last data filed at 02/25/15 0924  Gross per 24 hour  Intake    780 ml  Output    590 ml  Net    190 ml    PHYSICAL EXAMINATION:  GENERAL:  81 y.o.-year-old patient lying in the bed with no acute distress.  EYES: Pupils equal, round, reactive to light  and accommodation. No scleral icterus. Extraocular muscles intact.  HEENT: Head atraumatic, normocephalic. Oropharynx and nasopharynx clear.  NECK:  Supple, no jugular venous distention. No thyroid enlargement, no tenderness.  LUNGS: Normal breath sounds bilaterally, no wheezing, rales,rhonchi or crepitation. No use of accessory muscles of respiration.  CARDIOVASCULAR: S1, S2  normal. No murmurs, rubs, or gallops.  ABDOMEN: Soft, non-tender, non-distended. Bowel sounds present. No organomegaly or mass.  EXTREMITIES: No pedal edema, cyanosis, or clubbing.  NEUROLOGIC: Cranial nerves II through XII are intact. Muscle strength 5/5 in all extremities. Sensation intact. Gait not checked.  PSYCHIATRIC: The patient is alert and oriented x 3.  SKIN: No obvious rash, lesion, or ulcer.   DATA REVIEW:   CBC  Recent Labs Lab 02/24/15 0019  WBC 5.7  HGB 9.0*  HCT 27.5*  PLT 117*    Chemistries   Recent Labs Lab 02/23/15 1452  02/25/15 0955  NA 141  < > 139  K 5.5*  < > 4.6  CL 111  < > 114*  CO2 23  < > 22  GLUCOSE 100*  < > 106*  BUN 38*  < > 42*  CREATININE 2.35*  < > 2.14*  CALCIUM 8.3*  < > 8.0*  AST 15  --   --   ALT 9*  --   --   ALKPHOS 57  --   --   BILITOT 0.4  --   --   < > = values in this interval not displayed.  Cardiac Enzymes  Recent Labs Lab 02/23/15 1452  TROPONINI <0.03    Microbiology Results  No results found for this or any previous visit.  RADIOLOGY:  Dg Chest 2 View  02/23/2015  CLINICAL DATA:  Altered mental status EXAM: CHEST  2 VIEW COMPARISON:  None. FINDINGS: Interstitial coarsening which appears subpleural and is associated with mildly low lung volumes. No gross honeycombing. There is no edema, consolidation, effusion, or pneumothorax. Mild cardiomegaly. Negative aortic and hilar contours. IMPRESSION: No pneumonia or edema. Suspect interstitial lung disease. Electronically Signed   By: Marnee Spring M.D.   On: 02/23/2015 16:31   Ct Head Wo Contrast  02/23/2015  CLINICAL DATA:  Patient with altered mental status.  Dizziness. EXAM: CT HEAD WITHOUT CONTRAST TECHNIQUE: Contiguous axial images were obtained from the base of the skull through the vertex without intravenous contrast. COMPARISON:  None. FINDINGS: Ventricles and sulci are prominent compatible with atrophy. Periventricular and subcortical white matter  hypodensity compatible with chronic small vessel ischemic changes. Left basal ganglia calcifications. Focal area of hypodensity within the left occipital lobe (image 16; series 2), most compatible with chronic infarct. No evidence for acute intracranial hemorrhage or mass lesion. Calvarium is intact. Paranasal sinuses are unremarkable. Mastoid air cells are well aerated. IMPRESSION: No acute intracranial process. 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 Cortical atrophy and chronic small vessel ischemic changes. Electronically Signed   By: Annia Belt M.D.   On: 02/23/2015 16:24   US Renal  02/24/2015  CLINICAL DATA:  Acute renal failure. EXAM: RENAL / URINARY TRACT ULTRASOUND COMPLETE COMPARISON:  None. FINDINGS: Right Kidney: Length: 8.1 cm. Parenchymal echogenicity is within normal limits. Anechoic lesions with increased through transmission measure up to 1.9 x 1.3 x 1.7 cm in the interpolar region. The largest lesion has an internal septation, indicative of minimal complexity. No solid lesion or hydronephrosis. Left Kidney: Length: 8.0 cm. Parenchymal echogenicity is within normal limits. Anechoic lesions with increased through transmission measure up to 3.6  x 2.9 x 3.5 cm in the interpolar region. No solid lesion. No hydronephrosis. Bladder: Appears normal for degree of bladder distention. Other: Right pleural effusion is incidentally noted. IMPRESSION: 1. Bilateral renal cysts, 1 of which is minimally complex on the right. 2. Right pleural effusion. Electronically Signed   By: Leanna Battles M.D.   On: 02/24/2015 17:06    EKG:   Orders placed or performed during the hospital encounter of 02/23/15  . ED EKG  . ED EKG      Management plans discussed with the patient, family and they are in agreement.  CODE STATUS:     Code Status Orders        Start     Ordered   02/23/15 2137  Full code   Continuous     02/23/15 2136    Advance Directive Documentation        Most Recent Value    Type of Advance Directive  Living will, Healthcare Power of Attorney   Pre-existing out of facility DNR order (yellow form or pink MOST form)     "MOST" Form in Place?        TOTAL TIME TAKING CARE OF THIS PATIENT: 40 minutes.   Discussed with care management Armistead Sult M.D on 02/25/2015 at 1:56 PM  Between 7am to 6pm - Pager - [phone]  After 6pm go to [email or web address] - password [email or web address]  [facility]  Office  [phone]  CC: Primary care physician; No primary care provider on file.

## 2015-02-25 NOTE — Care Management Important Message (Signed)
Important Message  Patient Details  Name: Mathew Herring MRN: [id number] Date of Birth: 02/22/1916   Medicare Important Message Given:  Yes-second notification given    Verita SchneidersKathy A Allmond 02/25/2015, 9:46 AM

## 2015-02-25 NOTE — Consult Note (Signed)
Date: 02/25/2015                  Patient Name:  Mathew Herring  MRN: [id number]  DOB: 08-02-1915  Age / Sex: 81 y.o., male         PCP: No primary care provider on file.                 Service Requesting Consult:  internal medicine                  Reason for Consult:  chronic kidney disease             History of Present Illness: Patient is a 81 y.o. male with no significant past medical history, and never been hospitalized, [employer] War [employer] veteran, who was admitted to [facility] on 02/23/2015 for evaluation of lightheadedness, slurred speech. CT of the head was negative for any acute intracranial events. Nephrology consult was requested for evaluation of elevated creatinine. Patient has not had any lab work in the recent past for comparison. He does not have a primary care doctor. Today's creatinine is 2.14/GFR of 24 Renal ultrasound shows bilateral renal cysts and small pleural effusion Urinalysis is negative for glucose, hemoglobin, ketones, leukocytes. Small amount of protein was noted  Medications: Outpatient medications: Prescriptions prior to admission  Medication Sig Dispense Refill Last Dose  . glucosamine-chondroitin 500-400 MG tablet Take 1 tablet by mouth daily.   02/22/2015 at Unknown time  . Multiple Vitamins-Minerals (PRESERVISION AREDS 2) CAPS Take 1 capsule by mouth 2 (two) times daily.   02/22/2015 at Unknown time    Current medications: Current Facility-Administered Medications  Medication Dose Route Frequency Provider Last Rate Last Dose  . 0.9 %  sodium chloride infusion   Intravenous Continuous Enedina Finner, MD 75 mL/hr at 02/23/15 2220    . acetaminophen (TYLENOL) tablet 650 mg  650 mg Oral Q6H PRN Enedina Finner, MD       Or  . acetaminophen (TYLENOL) suppository 650 mg  650 mg Rectal Q6H PRN Enedina Finner, MD      . alum & mag hydroxide-simeth (MAALOX/MYLANTA) 200-200-20 MG/5ML suspension 30 mL  30 mL Oral Q6H PRN Enedina Finner, MD      . amLODipine (NORVASC) tablet 5 mg  5  mg Oral Daily Katharina Caper, MD   5 mg at 02/25/15 1004  . heparin injection 5,000 Units  5,000 Units Subcutaneous Q12H Enedina Finner, MD   5,000 Units at 02/25/15 1005  . multivitamin-lutein (OCUVITE-LUTEIN) capsule 1 capsule  1 capsule Oral BID Enedina Finner, MD   1 capsule at 02/25/15 1004  . senna-docusate (Senokot-S) tablet 1 tablet  1 tablet Oral QHS PRN Enedina Finner, MD          Allergies: No Known Allergies    Past Medical History: History reviewed. No pertinent past medical history.   Past Surgical History: History reviewed. No pertinent past surgical history.   Family History: No family history on file.   Social History: Social History   Social History  . Marital Status: Single    Spouse Name: N/A  . Number of Children: N/A  . Years of Education: N/A   Occupational History  . Not on file.   Social History Main Topics  . Smoking status: Never Smoker   . Smokeless tobacco: Not on file  . Alcohol Use: Not on file  . Drug Use: Not on file  . Sexual Activity: Not on file   Other Topics Concern  .  Not on file   Social History Narrative  . No narrative on file     Review of Systems: Gen:  HEENT:  CV:  Resp:  GI: GU :  MS:  Derm:   Psych: Heme:  Neuro:  Endocrine  Vital Signs: Blood pressure 134/64, pulse 61, temperature 97.8 F (36.6 C), temperature source Oral, resp. rate 18, height 5\' 10"  (1.778 m), weight 57.743 kg (127 lb 4.8 oz), SpO2 99 %.   Intake/Output Summary (Last 24 hours) at 02/25/15 1235 Last data filed at 02/25/15 [id number]  Gross per 24 hour  Intake    780 ml  Output    590 ml  Net    190 ml    Weight trends: [employer]   02/23/15 2116  Weight: 57.743 kg (127 lb 4.8 oz)    Physical Exam: General:  thin frail elderly man, laying in the bed   HEENT  anicteric, moist mucous membranes   Neck:  supple, no masses   Lungs:  normal respiratory effort, clear anteriorly and laterally   Heart::  regular rhythm, no rub or gallop    Abdomen:  soft, nontender, nondistended   Extremities:  no peripheral edema   Neurologic:  alert, decreased hearing, oriented, nonfocal   Skin:  no acute rashes   Access:   Foley:        Lab results: Basic Metabolic Panel:  Recent Labs Lab 02/23/15 1452 02/24/15 0019 02/24/15 0641 02/25/15 0955  NA 141  --  141 139  K 5.5*  --  5.0 4.6  CL 111  --  113* 114*  CO2 23  --  22 22  GLUCOSE 100*  --  115* 106*  BUN 38*  --  43* 42*  CREATININE 2.35* 2.41* 2.33* 2.14*  CALCIUM 8.3*  --  8.0* 8.0*    Liver Function Tests:  Recent Labs Lab 02/23/15 1452  AST 15  ALT 9*  ALKPHOS 57  BILITOT 0.4  PROT 5.8*  ALBUMIN 3.6   No results for input(s): LIPASE, AMYLASE in the last 168 hours. No results for input(s): AMMONIA in the last 168 hours.  CBC:  Recent Labs Lab 02/23/15 1452 02/24/15 0019  WBC 6.0 5.7  HGB 10.0* 9.0*  HCT 30.3* 27.5*  MCV 99.9 98.5  PLT 124* 117*    Cardiac Enzymes:  Recent Labs Lab 02/23/15 1452  TROPONINI <0.03    BNP: Invalid input(s): POCBNP  CBG: No results for input(s): GLUCAP in the last 168 hours.  Microbiology: No results found for this or any previous visit (from the past 720 hour(s)).   Coagulation Studies: No results for input(s): LABPROT, INR in the last 72 hours.  Urinalysis:  Recent Labs  02/23/15 1557  COLORURINE YELLOW*  LABSPEC 1.013  PHURINE 7.0  GLUCOSEU NEGATIVE  HGBUR NEGATIVE  BILIRUBINUR NEGATIVE  KETONESUR NEGATIVE  PROTEINUR 30*  NITRITE NEGATIVE  LEUKOCYTESUR NEGATIVE      Imaging: Dg Chest 2 View  02/23/2015  CLINICAL DATA:  Altered mental status EXAM: CHEST  2 VIEW COMPARISON:  None. FINDINGS: Interstitial coarsening which appears subpleural and is associated with mildly low lung volumes. No gross honeycombing. There is no edema, consolidation, effusion, or pneumothorax. Mild cardiomegaly. Negative aortic and hilar contours. IMPRESSION: No pneumonia or edema. Suspect interstitial  lung disease. Electronically Signed   By: Marnee SpringJonathon  Watts M.D.   On: 02/23/2015 16:31   Ct Head Wo Contrast  02/23/2015  CLINICAL DATA:  Patient with altered mental status.  Dizziness.  EXAM: CT HEAD WITHOUT CONTRAST TECHNIQUE: Contiguous axial images were obtained from the base of the skull through the vertex without intravenous contrast. COMPARISON:  None. FINDINGS: Ventricles and sulci are prominent compatible with atrophy. Periventricular and subcortical white matter hypodensity compatible with chronic small vessel ischemic changes. Left basal ganglia calcifications. Focal area of hypodensity within the left occipital lobe (image 16; series 2), most compatible with chronic infarct. No evidence for acute intracranial hemorrhage or mass lesion. Calvarium is intact. Paranasal sinuses are unremarkable. Mastoid air cells are well aerated. IMPRESSION: No acute intracranial process. 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 Cortical atrophy and chronic small vessel ischemic changes. Electronically Signed   By: Annia Belt M.D.   On: 02/23/2015 16:24   US Renal  02/24/2015  CLINICAL DATA:  Acute renal failure. EXAM: RENAL / URINARY TRACT ULTRASOUND COMPLETE COMPARISON:  None. FINDINGS: Right Kidney: Length: 8.1 cm. Parenchymal echogenicity is within normal limits. Anechoic lesions with increased through transmission measure up to 1.9 x 1.3 x 1.7 cm in the interpolar region. The largest lesion has an internal septation, indicative of minimal complexity. No solid lesion or hydronephrosis. Left Kidney: Length: 8.0 cm. Parenchymal echogenicity is within normal limits. Anechoic lesions with increased through transmission measure up to 3.6 x 2.9 x 3.5 cm in the interpolar region. No solid lesion. No hydronephrosis. Bladder: Appears normal for degree of bladder distention. Other: Right pleural effusion is incidentally noted. IMPRESSION: 1. Bilateral renal cysts, 1 of which is minimally complex on the right. 2. Right  pleural effusion. Electronically Signed   By: Leanna Battles M.D.   On: 02/24/2015 17:06      Assessment & Plan: Pt is a 81 y.o. yo male with no significant past medical history, no hospitalizations, no PMD was admitted on 02/23/2015 with lightheadedness and slurred speech and is found to have elevated serum creatinine.   1. Chronic kidney disease stage IV Urinalysis is negative for blood, hemoglobin. Small amount of protein is noted Renal ultrasound is negative for obstruction. Small renal cysts are noted It appears that chronic kidney disease is likely secondary to atherosclerosis. There are no previous labs available for comparison. Patient has never been hospitalized and does not have a primary care doctor. Current creatinine appears to be his baseline. Today's creatinine is 2.14/GFR 24. 2. Mild anemia  Hemoglobin 9.0 Will order SPEP, UPEP Discussed with his daughter Mathew Herring that Patient may follow up with PMD. If wide variations in renal function are noted, we would be happy to reassess in the office.

## 2015-02-25 NOTE — Progress Notes (Signed)
[facility]  [facility]   PATIENT NAME: Mathew SchatzJohn Herring    MR#:  [id number]  DATE OF BIRTH:  09/19/1915  SUBJECTIVE:  CHIEF COMPLAINT:   Chief Complaint  Patient presents with  . Altered Mental Status   patient is 81 year old Caucasian male was no significant past medical history who presents to the hospital with swimming headedness and weakness. In the emergency room he was noted to be hypertensive with systolic blood pressure agent from 140s to 170s. His labs revealed renal failure. Patient was given IV fluids was no significant improvement of his kidney function. Bladder was scanned and it did not show retention. Ultrasound of kidneys as well as nephrology consultation is pending. Patient denies using nonsteroidal anti-inflammatory medications at home.  Patient feels poorly today covered under the sheets is not willing to talk much, complains of not being able to rest. Sitter was initiated due to patient being restless and risks of fall Review of Systems  Constitutional: Negative for fever, chills and weight loss.  HENT: Negative for congestion.   Eyes: Negative for blurred vision and double vision.  Respiratory: Negative for cough, sputum production, shortness of breath and wheezing.   Cardiovascular: Negative for chest pain, palpitations, orthopnea, leg swelling and PND.  Gastrointestinal: Negative for nausea, vomiting, abdominal pain, diarrhea, constipation and blood in stool.  Genitourinary: Negative for dysuria, urgency, frequency and hematuria.  Musculoskeletal: Negative for falls.  Neurological: Negative for dizziness, tremors, focal weakness and headaches.  Endo/Heme/Allergies: Does not bruise/bleed easily.  Psychiatric/Behavioral: Negative for depression. The patient does not have insomnia.     VITAL SIGNS: Blood pressure 134/64, pulse 61, temperature 97.8 F (36.6 C), temperature source Oral, resp. rate 18, height 5\' 10"  (1.778 m), weight  57.743 kg (127 lb 4.8 oz), SpO2 99 %.  PHYSICAL EXAMINATION:   GENERAL:  81 y.o.-year-old patient lying in the bed with no acute distress.  EYES: Pupils equal, round, reactive to light and accommodation. No scleral icterus. Extraocular muscles intact.  HEENT: Head atraumatic, normocephalic. Oropharynx and nasopharynx clear.  NECK:  Supple, no jugular venous distention. No thyroid enlargement, no tenderness.  LUNGS: Normal breath sounds bilaterally, no wheezing, bilateral posterior rales,rhonchi , but no crepitations. No use of accessory muscles of respiration.  CARDIOVASCULAR: S1, S2 normal. No murmurs, rubs, or gallops.  ABDOMEN: Soft, nontender, nondistended. Bowel sounds present. No organomegaly or mass.  EXTREMITIES: No pedal edema, cyanosis, or clubbing.  NEUROLOGIC: Cranial nerves II through XII are intact. Muscle strength 5/5 in all extremities. Sensation intact. Gait not checked.  PSYCHIATRIC: The patient is alert and oriented x 3.  SKIN: No obvious rash, lesion, or ulcer.   ORDERS/RESULTS REVIEWED:   CBC  Recent Labs Lab 02/23/15 1452 02/24/15 0019  WBC 6.0 5.7  HGB 10.0* 9.0*  HCT 30.3* 27.5*  PLT 124* 117*  MCV 99.9 98.5  MCH 33.1 32.1  MCHC 33.1 32.6  RDW 15.4* 15.0*   ------------------------------------------------------------------------------------------------------------------  Chemistries   Recent Labs Lab 02/23/15 1452 02/24/15 0019 02/24/15 0641 02/25/15 0955  NA 141  --  141 139  K 5.5*  --  5.0 4.6  CL 111  --  113* 114*  CO2 23  --  22 22  GLUCOSE 100*  --  115* 106*  BUN 38*  --  43* 42*  CREATININE 2.35* 2.41* 2.33* 2.14*  CALCIUM 8.3*  --  8.0* 8.0*  AST 15  --   --   --   ALT 9*  --   --   --  ALKPHOS 57  --   --   --   BILITOT 0.4  --   --   --    ------------------------------------------------------------------------------------------------------------------ estimated creatinine clearance is 16.1 mL/min (by C-G formula based on  Cr of 2.14). ------------------------------------------------------------------------------------------------------------------ No results for input(s): TSH, T4TOTAL, T3FREE, THYROIDAB in the last 72 hours.  Invalid input(s): FREET3  Cardiac Enzymes  Recent Labs Lab 02/23/15 1452  TROPONINI <0.03   ------------------------------------------------------------------------------------------------------------------ Invalid input(s): POCBNP ---------------------------------------------------------------------------------------------------------------  RADIOLOGY: Dg Chest 2 View  02/23/2015  CLINICAL DATA:  Altered mental status EXAM: CHEST  2 VIEW COMPARISON:  None. FINDINGS: Interstitial coarsening which appears subpleural and is associated with mildly low lung volumes. No gross honeycombing. There is no edema, consolidation, effusion, or pneumothorax. Mild cardiomegaly. Negative aortic and hilar contours. IMPRESSION: No pneumonia or edema. Suspect interstitial lung disease. Electronically Signed   By: Marnee Spring M.D.   On: 02/23/2015 16:31   Ct Head Wo Contrast  02/23/2015  CLINICAL DATA:  Patient with altered mental status.  Dizziness. EXAM: CT HEAD WITHOUT CONTRAST TECHNIQUE: Contiguous axial images were obtained from the base of the skull through the vertex without intravenous contrast. COMPARISON:  None. FINDINGS: Ventricles and sulci are prominent compatible with atrophy. Periventricular and subcortical white matter hypodensity compatible with chronic small vessel ischemic changes. Left basal ganglia calcifications. Focal area of hypodensity within the left occipital lobe (image 16; series 2), most compatible with chronic infarct. No evidence for acute intracranial hemorrhage or mass lesion. Calvarium is intact. Paranasal sinuses are unremarkable. Mastoid air cells are well aerated. IMPRESSION: No acute intracranial process. 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 Cortical atrophy  and chronic small vessel ischemic changes. Electronically Signed   By: Annia Belt M.D.   On: 02/23/2015 16:24   US Renal  02/24/2015  CLINICAL DATA:  Acute renal failure. EXAM: RENAL / URINARY TRACT ULTRASOUND COMPLETE COMPARISON:  None. FINDINGS: Right Kidney: Length: 8.1 cm. Parenchymal echogenicity is within normal limits. Anechoic lesions with increased through transmission measure up to 1.9 x 1.3 x 1.7 cm in the interpolar region. The largest lesion has an internal septation, indicative of minimal complexity. No solid lesion or hydronephrosis. Left Kidney: Length: 8.0 cm. Parenchymal echogenicity is within normal limits. Anechoic lesions with increased through transmission measure up to 3.6 x 2.9 x 3.5 cm in the interpolar region. No solid lesion. No hydronephrosis. Bladder: Appears normal for degree of bladder distention. Other: Right pleural effusion is incidentally noted. IMPRESSION: 1. Bilateral renal cysts, 1 of which is minimally complex on the right. 2. Right pleural effusion. Electronically Signed   By: Leanna Battles M.D.   On: 02/24/2015 17:06    EKG:  Orders placed or performed during the hospital encounter of 02/23/15  . ED EKG  . ED EKG    ASSESSMENT AND PLAN:  Active Problems:   Acute renal failure (ARF) (HCC) 1. Acute on chronic, likely renal failure , some improvement with IV fluid administration, but now patient seems to be fluid overloaded clinically , no obvious urinary retention, ultrasound of kidneys revealed bilateral renal cysts and the right pleural effusion, nephrology consultation is pending, follow kidney function closely. No UTI. Hemoglobin A1c 5.4. No diabetes. Questionable discharge patient home with home health and the outpatient nephrology follow-up 2. Hyperkalemia , resolved with therapy. 3. Hyperglycemia. Hemoglobin A1c was 5.4, no diabetes 4. Anemia get guaiac 5. Hypertension,  initiate on Norvasc, improved blood pressure readings 6. Acute fluid  overload, pulmonary edema, clinically, get echocardiogram, off IV fluids  7. Generalized weakness. Discharge patient home with home health, according to physical therapist recommendations  Management plans discussed with the patient, family and they are in agreement.   DRUG ALLERGIES: No Known Allergies  CODE STATUS:     Code Status Orders        Start     Ordered   02/23/15 2137  Full code   Continuous     02/23/15 2136      TOTAL TIME TAKING CARE OF THIS PATIENT: 40 minutes.    Katharina Caper M.D on 02/25/2015 at 10:57 AM  Between 7am to 6pm - Pager - [phone]  After 6pm go to [email or web address] - password [email or web address] [facility]  [facility]  Office  [phone]  CC: Primary care physician; No primary care provider on file.

## 2015-02-25 NOTE — Care Management Note (Addendum)
Case Management Note  Patient Details  Name: Mathew SchatzJohn Herring MRN: [id number] Date of Birth: 02/06/1916  Subjective/Objective:    CSW spoke with daughter Gigi Gineggy [phone]. Daughter is going to increase the private duty care patient has and take patient home. Patient has no PCP. Daughter will attempt to make him an appointment with a local MD. No home health services available a PCP.                Action/Plan:   Expected Discharge Date:                  Expected Discharge Plan:     In-House Referral:     Discharge planning Services  CM Consult  Post Acute Care Choice:  Home Health Choice offered to:  Patient  DME Arranged:    DME Agency:     HH Arranged:    HH Agency:     Status of Service:     Medicare Important Message Given:  Yes-second notification given Date Medicare IM Given:    Medicare IM give by:    Date Additional Medicare IM Given:    Additional Medicare Important Message give by:     If discussed at Long Length of Stay Meetings, dates discussed:    Additional Comments:  Marily MemosLisa M Jacobs, RN 02/25/2015, 1:57 PM

## 2015-02-25 NOTE — Care Management Note (Signed)
Case Management Note  Patient Details  Name: Dennies Coate MRN: [id number] Date of Birth: 10/19/1915  Subjective/Objective:  TC received from Dr. Ether Griffins that she was ready to discharge patient with home health nursing. Discussed with primary nurse who reports patient is very confused and unsteady. Met with daughter at bedside. She reports patient has in home caregivers 2 days per week for 4 hours per day. She states he may need SNF. Message to [place] regarding current request for further evaluation                   Action/Plan: Discharge disposition pending.   Expected Discharge Date:                  Expected Discharge Plan:     In-House Referral:     Discharge planning Services  CM Consult  Post Acute Care Choice:  Home Health Choice offered to:  Patient  DME Arranged:    DME Agency:     HH Arranged:    [place] Agency:     Status of Service:     Medicare Important Message Given:  Yes-second notification given Date Medicare IM Given:    Medicare IM give by:    Date Additional Medicare IM Given:    Additional Medicare Important Message give by:     If discussed at [place] of Stay Meetings, dates discussed:    Additional Comments:  Jolly Mango, RN 02/25/2015, 1:16 PM

## 2015-02-25 NOTE — Progress Notes (Signed)
*  PRELIMINARY RESULTS* Echocardiogram 2D Echocardiogram has been performed.  Georgann HousekeeperJerry R Hege 02/25/2015, 2:59 PM

## 2015-02-25 NOTE — Clinical Social Work Note (Signed)
Clinical Social Work Assessment  Patient Details  Name: Mathew Herring MRN: [id number] Date of Birth: [birth year]  Date of referral:  02/25/15               Reason for consult:  Facility Placement, Other (Comment Required) (Patient does not meet criteria for SNF)                Permission sought to share information with:  Case Manager Permission granted to share information::  Yes, Verbal Permission Granted  Name::      RN Case Manager   Agency::     Relationship::     Contact Information:     Housing/Transportation Living arrangements for the past 2 months:  Single Family Home Source of Information:  Patient, Power of [employer], Adult Children Patient Interpreter Needed:  None Criminal Activity/Legal Involvement Pertinent to Current Situation/Hospitalization:  No - Comment as needed Significant Relationships:  Adult Children Lives with:  Self Do you feel safe going back to the place where you live?  Yes Need for family participation in patient care:  Yes (Comment)  Care giving concerns: Patient lives alone in [place] with hired caregivers 2 days a week.    Social Worker assessment / plan: Holiday representative (CSW) received SNF consult. Patient walked 190 feet with physical therapy and does not meet criteria for rehab. CSW met with patient and his daughter Vickii Chafe HPOA was at bedside. Patient was sitting up eating lunch and was alert and oriented. CSW explained that patient does not meet criteria for SNF at this time under his insurance. CSW also explained private pay vs. Medicaid for ALF's and SNF's . Daughter reported that patient can't pay privately for SNF or ALF. Daughter reported that patient lives alone in [place] with hired caregivers from [employer] [facility] and Thursday's from 1-5 pm. Daughter reported that patient was getting caregivers 3 days a week however they determined that patient did not need that much time. Per daughter patient's wife passed away 8 years ago  and he has been living alone since then. Daughter reported that patient takes no medications and this is his first hospitalization. Daughter is agreeable to home health and discharging today. Patient is agreeable to returning home today. RN Case Manager aware of above. Please reconsult if future social work needs arise. CSW signing off.    Employment status:  Disabled (Comment on whether or not currently receiving Disability), Retired Nurse, adult PT Recommendations:  Home with [place] / Referral to community resources:  Other (Comment Required) (Home Health RN Case Manager )  Patient/Family's Response to care: Patient and daughter are agreeable to home health and discharging today.   Patient/Family's Understanding of and Emotional Response to Diagnosis, Current Treatment, and Prognosis: Patient and daughter were pleasant and thanked CSW for visit.   Emotional Assessment Appearance:  Appears stated age Attitude/Demeanor/Rapport:    Affect (typically observed):  Accepting, Adaptable, Pleasant Orientation:  Oriented to Self, Oriented to Place, Oriented to  Time, Oriented to Situation Alcohol / Substance use:  Not Applicable Psych involvement (Current and /or in the community):  No (Comment)  Discharge Needs  Concerns to be addressed:  Discharge Planning Concerns Readmission within the last 30 days:  No Current discharge risk:  None Barriers to Discharge:  No Barriers Identified   Loralyn Freshwater, LCSW 02/25/2015, 2:19 PM

## 2015-02-25 NOTE — Progress Notes (Signed)
Dr. Sheryle Hailiamond gave order for safety sitter at the bedside.

## 2015-08-28 ENCOUNTER — Emergency Department
Admission: EM | Admit: 2015-08-28 | Discharge: 2015-08-28 | Disposition: A | Discharge status: Home / Self Care | Attending: Emergency Medicine | Admitting: Emergency Medicine

## 2015-08-28 ENCOUNTER — Emergency Department

## 2015-08-28 DIAGNOSIS — I120 Hypertensive chronic kidney disease with stage 5 chronic kidney disease or end stage renal disease: Secondary | ICD-10-CM | POA: Insufficient documentation

## 2015-08-28 DIAGNOSIS — Z79899 Other long term (current) drug therapy: Secondary | ICD-10-CM | POA: Insufficient documentation

## 2015-08-28 DIAGNOSIS — M791 Myalgia: Secondary | ICD-10-CM | POA: Insufficient documentation

## 2015-08-28 DIAGNOSIS — E860 Dehydration: Secondary | ICD-10-CM | POA: Insufficient documentation

## 2015-08-28 DIAGNOSIS — F0390 Unspecified dementia without behavioral disturbance: Secondary | ICD-10-CM | POA: Diagnosis not present

## 2015-08-28 DIAGNOSIS — N186 End stage renal disease: Secondary | ICD-10-CM | POA: Diagnosis not present

## 2015-08-28 DIAGNOSIS — M25511 Pain in right shoulder: Secondary | ICD-10-CM | POA: Diagnosis present

## 2015-08-28 DIAGNOSIS — M7918 Myalgia, other site: Secondary | ICD-10-CM

## 2015-08-28 HISTORY — DX: Anemia, unspecified: D64.9

## 2015-08-28 HISTORY — DX: Hyperkalemia: E87.5

## 2015-08-28 HISTORY — DX: End stage renal disease: N18.6

## 2015-08-28 HISTORY — DX: Unspecified dementia, unspecified severity, without behavioral disturbance, psychotic disturbance, mood disturbance, and anxiety: F03.90

## 2015-08-28 LAB — CBC WITH DIFFERENTIAL/PLATELET
BASOS PCT: 0 %
Basophils Absolute: 0 10*3/uL (ref 0–0.1)
EOS ABS: 0 10*3/uL (ref 0–0.7)
Eosinophils Relative: 1 %
HCT: 29.6 % — ABNORMAL LOW (ref 40.0–52.0)
HEMOGLOBIN: 9.4 g/dL — AB (ref 13.0–18.0)
Lymphocytes Relative: 13 %
Lymphs Abs: 0.9 10*3/uL — ABNORMAL LOW (ref 1.0–3.6)
MCH: 30.4 pg (ref 26.0–34.0)
MCHC: 31.9 g/dL — AB (ref 32.0–36.0)
MCV: 95.5 fL (ref 80.0–100.0)
MONOS PCT: 7 %
Monocytes Absolute: 0.5 10*3/uL (ref 0.2–1.0)
NEUTROS PCT: 79 %
Neutro Abs: 5.5 10*3/uL (ref 1.4–6.5)
PLATELETS: 128 10*3/uL — AB (ref 150–440)
RBC: 3.1 MIL/uL — ABNORMAL LOW (ref 4.40–5.90)
RDW: 16.5 % — AB (ref 11.5–14.5)
WBC: 7 10*3/uL (ref 3.8–10.6)

## 2015-08-28 LAB — BASIC METABOLIC PANEL
Anion gap: 5 (ref 5–15)
BUN: 39 mg/dL — ABNORMAL HIGH (ref 6–20)
CO2: 20 mmol/L — AB (ref 22–32)
CREATININE: 2.41 mg/dL — AB (ref 0.61–1.24)
Calcium: 8.2 mg/dL — ABNORMAL LOW (ref 8.9–10.3)
Chloride: 113 mmol/L — ABNORMAL HIGH (ref 101–111)
GFR calc non Af Amer: 21 mL/min — ABNORMAL LOW (ref >60)
GFR, EST AFRICAN AMERICAN: 24 mL/min — AB (ref >60)
Glucose, Bld: 103 mg/dL — ABNORMAL HIGH (ref 65–99)
Potassium: 5.3 mmol/L — ABNORMAL HIGH (ref 3.5–5.1)
SODIUM: 138 mmol/L (ref 135–145)

## 2015-08-28 LAB — URINALYSIS COMPLETE WITH MICROSCOPIC (ARMC ONLY)
BACTERIA UA: NONE SEEN
Bilirubin Urine: NEGATIVE
Glucose, UA: NEGATIVE mg/dL
HGB URINE DIPSTICK: NEGATIVE
Ketones, ur: NEGATIVE mg/dL
LEUKOCYTES UA: NEGATIVE
NITRITE: NEGATIVE
Protein, ur: NEGATIVE mg/dL
Specific Gravity, Urine: 1.009 (ref 1.005–1.030)
Squamous Epithelial / LPF: NONE SEEN
pH: 6 (ref 5.0–8.0)

## 2015-08-28 LAB — GLUCOSE, CAPILLARY: GLUCOSE-CAPILLARY: 101 mg/dL — AB (ref 65–99)

## 2015-08-28 MED ORDER — ACETAMINOPHEN 325 MG PO TABS
ORAL_TABLET | ORAL | Status: AC
  Administered 2015-08-28: 650 mg via ORAL
  Filled 2015-08-28: qty 2

## 2015-08-28 MED ORDER — ACETAMINOPHEN 325 MG PO TABS
650.0000 mg | ORAL_TABLET | Freq: Once | ORAL | Status: AC
  Administered 2015-08-28: 650 mg via ORAL

## 2015-08-28 NOTE — ED Provider Notes (Signed)
[facility] Emergency Department Provider Note  ____________________________________________  Time seen: 3:20 PM  I have reviewed the triage vital signs and the nursing notes.   HISTORY  Chief Complaint Fall  Level 5 caveat:  Portions of the history and physical were unable to be obtained due to the patient's chronic dementia   HPI Mathew SchatzJohn Dickman is a 82 y.o. male with chronic dementia brought to the ED due to a fall today.Patient does not remember. He complains of right shoulder pain and denies any other symptoms. He has a CODE STATUS of DO NOT RESUSCITATE. He is in the hospice program. He has home health services 5 days a week. Per the home health aide at the bedside, the patient has not been eating or drinking much today.     Past Medical History  Diagnosis Date  . End stage renal disease (HCC)   . Dementia   . Anemia   . Hyperkalemia      Patient Active Problem List   Diagnosis Date Noted  . Hyperkalemia 02/25/2015  . Hyperglycemia 02/25/2015  . Anemia 02/25/2015  . Essential hypertension 02/25/2015  . Chronic kidney disease (CKD), stage IV (severe) (HCC) 02/23/2015     History reviewed. No pertinent past surgical history.   Current Outpatient Rx  Name  Route  Sig  Dispense  Refill  . amLODipine (NORVASC) 5 MG tablet   Oral   Take 1 tablet (5 mg total) by mouth daily.   30 tablet   6   . glucosamine-chondroitin 500-400 MG tablet   Oral   Take 1 tablet by mouth daily.         . Multiple Vitamins-Minerals (PRESERVISION AREDS 2) CAPS   Oral   Take 1 capsule by mouth 2 (two) times daily.            Allergies Penicillins   No family history on file.  Social History Social History  Substance Use Topics  . Smoking status: Never Smoker   . Smokeless tobacco: None  . Alcohol Use: No    Review of Systems Musculoskeletal:   Positive for right shoulder pain Neurological:   Chronic dementia Review of systems otherwise  negative, but history is unreliable due to dementia.  ____________________________________________   PHYSICAL EXAM:  VITAL SIGNS: ED Triage Vitals  Enc Vitals Group     BP 08/28/15 1438 114/61 mmHg     Pulse Rate 08/28/15 1438 64     Resp 08/28/15 1438 15     Temp --      Temp src --      SpO2 08/28/15 1438 100 %     Weight 08/28/15 1433 1263 lb 3.8 oz (573 kg)     Height 08/28/15 1433 6' (1.829 m)     Head Cir --      Peak Flow --      Pain Score --      Pain Loc --      Pain Edu? --      Excl. in GC? --     Vital signs reviewed, nursing assessments reviewed.   Constitutional:   Alert and orientedTo self. Well appearing and in no distress. Eyes:   No scleral icterus. No conjunctival pallor. PERRL. EOMI ENT   Head:   Normocephalic and atraumatic.   Nose:   No congestion/rhinnorhea. No septal hematoma   Mouth/Throat:   Dry mucous membranes, no pharyngeal erythema. No peritonsillar mass.    Neck:   No stridor. No SubQ  emphysema. No meningismus. Hematological/Lymphatic/Immunilogical:   No cervical lymphadenopathy. Cardiovascular:   RRR. Symmetric bilateral radial and DP pulses.  No murmurs.  Respiratory:   Normal respiratory effort without tachypnea nor retractions. Breath sounds are clear and equal bilaterally. No wheezes/rales/rhonchi. Gastrointestinal:   Soft and nontender. Non distended. There is no CVA tenderness.  No rebound, rigidity, or guarding. Genitourinary:   deferred Musculoskeletal:   Nontender with normal range of motion in all extremities. No joint effusions.  No lower extremity tenderness.  No edema. Neurologic:   Normal speech and language.  CN 2-10 normal. Motor grossly intact. No gross focal neurologic deficits are appreciated.  Skin:    Skin is warm, dry and intact. No rash noted.  No petechiae, purpura, or bullae.  ____________________________________________    LABS (pertinent positives/negatives) (all labs ordered are listed, but  only abnormal results are displayed) Labs Reviewed  GLUCOSE, CAPILLARY - Abnormal; Notable for the following:    Glucose-Capillary 101 (*)    All other components within normal limits  URINALYSIS COMPLETEWITH MICROSCOPIC ([facility] ONLY) - Abnormal; Notable for the following:    Color, Urine STRAW (*)    APPearance CLEAR (*)    All other components within normal limits  BASIC METABOLIC PANEL - Abnormal; Notable for the following:    Potassium 5.3 (*)    Chloride 113 (*)    CO2 20 (*)    Glucose, Bld 103 (*)    BUN 39 (*)    Creatinine, Ser 2.41 (*)    Calcium 8.2 (*)    GFR calc non Af Amer 21 (*)    GFR calc Af Amer 24 (*)    All other components within normal limits  CBC WITH DIFFERENTIAL/PLATELET - Abnormal; Notable for the following:    RBC 3.10 (*)    Hemoglobin 9.4 (*)    HCT 29.6 (*)    MCHC 31.9 (*)    RDW 16.5 (*)    Platelets 128 (*)    Lymphs Abs 0.9 (*)    All other components within normal limits  CBG MONITORING, ED   ____________________________________________   EKG  Interpreted by me Sinus rhythm rate of 59, left axis, normal intervals. Poor R-wave progression in anterior precordial leads.Normal QRS. T wave inversion in V5 and V6. No previous EKGs to compare to.  ____________________________________________    RADIOLOGY  X-ray right humerus unremarkable  ____________________________________________   PROCEDURES   ____________________________________________   INITIAL IMPRESSION / ASSESSMENT AND PLAN / ED COURSE  Pertinent labs & imaging results that were available during my care of the patient were reviewed by me and considered in my medical decision making (see chart for details).  Patient not in distress, relatively common comfortable. He is able to relate that he is having shoulder pain, and specifically denies any chest pain or shortness of breath. Low suspicion for ACS at this point. No evidence of PE or dissection. Patient's chronic renal  insufficiency is at baseline. No urinary tract infection. He is calm and comfortable. Care discussed with his daughter Gigi Gin who will work with primary care and hospice to arrange continuous care. She has arranged for him to have a home health aide through the night and his neighbors will also help take care of him through the weekend. He is medically stable at this point and will be discharged home.     ____________________________________________   FINAL CLINICAL IMPRESSION(S) / ED DIAGNOSES  Final diagnoses:  Mild dehydration  Musculoskeletal pain  Chronic dementia, without behavioral  disturbance  Chronic renal insufficiency     Portions of this note were generated with dragon dictation software. Dictation errors may occur despite best attempts at proofreading.   Sharman Cheek, MD 08/28/15 [id number]

## 2015-08-28 NOTE — Discharge Instructions (Signed)
Dehydration Dehydration is when you lose more fluids from the body than you take in. Vital organs such as the kidneys, brain, and heart cannot function without a proper amount of fluids and salt. Any loss of fluids from the body can cause dehydration.  Older adults are at a higher risk of dehydration than younger adults. As we age, our bodies are less able to conserve water and do not respond to temperature changes as well. Also, older adults do not become thirsty as easily or quickly. Because of this, older adults often do not realize they need to increase fluids to avoid dehydration.  CAUSES   Vomiting.  Diarrhea.  Excessive sweating.  Excessive urination.  Fever.  Certain medicines, such as blood pressure medicines called diuretics.  Poorly controlled blood sugars. SIGNS AND SYMPTOMS  Mild dehydration:  Thirst.  Dry lips.  Slightly dry mouth. Moderate dehydration:  Very dry mouth.  Sunken eyes.  Skin does not bounce back quickly when lightly pinched and released.  Dark urine and decreased urine production.  Decreased tear production.  Headache. Severe dehydration:  Very dry mouth.  Extreme thirst.  Rapid, weak pulse (more than 100 beats per minute at rest).  Cold hands and feet.  Not able to sweat in spite of heat.  Rapid breathing.  Blue lips.  Confusion and lethargy.  Difficulty being awakened.  Minimal urine production.  No tears. DIAGNOSIS  Your health care provider will diagnose dehydration based on your symptoms and your exam. Blood and urine tests will help confirm the diagnosis. The diagnostic evaluation should also identify the cause of dehydration. TREATMENT  Treatment of mild or moderate dehydration can often be done at home by increasing the amount of fluids that you drink. It is best to drink small amounts of fluid more often. Drinking too much at one time can make vomiting worse. Severe dehydration needs to be treated at the hospital.  You may be given IV fluids that contain water and electrolytes. HOME CARE INSTRUCTIONS   Ask your health care provider about specific rehydration instructions.  Drink enough fluids to keep your urine clear or pale yellow.  Drink small amounts frequently if you have nausea and vomiting.  Eat as you normally do.  Avoid:  Foods or drinks high in sugar.  Carbonated drinks.  Juice.  Extremely hot or cold fluids.  Drinks with caffeine.  Fatty, greasy foods.  Alcohol.  Tobacco.  Overeating.  Gelatin desserts.  Wash your hands well to avoid spreading bacteria and viruses.  Only take over-the-counter or prescription medicines for pain, discomfort, or fever as directed by your health care provider.  Ask your health care provider if you should continue all prescribed and over-the-counter medicines.  Keep all follow-up appointments with your health care provider. SEEK MEDICAL CARE IF:  You have abdominal pain, and it increases or stays in one area (localizes).  You have a rash, stiff neck, or severe headache.  You are irritable, sleepy, or difficult to awaken.  You are weak, dizzy, or extremely thirsty.  You have a fever. SEEK IMMEDIATE MEDICAL CARE IF:   You are unable to keep fluids down, or you get worse despite treatment.  You have frequent episodes of vomiting or diarrhea.  You have blood or green matter (bile) in your vomit.  You have blood in your stool, or your stool looks black and tarry.  You have not urinated in 6-8 hours, or you have only urinated a small amount of very dark urine.  You faint. MAKE SURE YOU:   Understand these instructions.  Will watch your condition.  Will get help right away if you are not doing well or get worse.   This information is not intended to replace advice given to you by your health care provider. Make sure you discuss any questions you have with your health care provider.   Document Released: 07/15/2003 Document  Revised: 04/29/2013 Document Reviewed: 12/30/2012 Elsevier Interactive Patient Education 2016 [employer] Inc[employer]  Musculoskeletal Pain Musculoskeletal pain is muscle and boney aches and pains. These pains can occur in any part of the body. Your caregiver may treat you without knowing the cause of the pain. They may treat you if blood or urine tests, X-rays, and other tests were normal.  CAUSES There is often not a definite cause or reason for these pains. These pains may be caused by a type of germ (virus). The discomfort may also come from overuse. Overuse includes working out too hard when your body is not fit. Boney aches also come from weather changes. Bone is sensitive to atmospheric pressure changes. HOME CARE INSTRUCTIONS   Ask when your test results will be ready. Make sure you get your test results.  Only take over-the-counter or prescription medicines for pain, discomfort, or fever as directed by your caregiver. If you were given medications for your condition, do not drive, operate machinery or power tools, or sign legal documents for 24 hours. Do not drink alcohol. Do not take sleeping pills or other medications that may interfere with treatment.  Continue all activities unless the activities cause more pain. When the pain lessens, slowly resume normal activities. Gradually increase the intensity and duration of the activities or exercise.  During periods of severe pain, bed rest may be helpful. Lay or sit in any position that is comfortable.  Putting ice on the injured area.  Put ice in a bag.  Place a towel between your skin and the bag.  Leave the ice on for 15 to 20 minutes, 3 to 4 times a day.  Follow up with your caregiver for continued problems and no reason can be found for the pain. If the pain becomes worse or does not go away, it may be necessary to repeat tests or do additional testing. Your caregiver may need to look further for a possible cause. SEEK IMMEDIATE MEDICAL  CARE IF:  You have pain that is getting worse and is not relieved by medications.  You develop chest pain that is associated with shortness or breath, sweating, feeling sick to your stomach (nauseous), or throw up (vomit).  Your pain becomes localized to the abdomen.  You develop any new symptoms that seem different or that concern you. MAKE SURE YOU:   Understand these instructions.  Will watch your condition.  Will get help right away if you are not doing well or get worse.   This information is not intended to replace advice given to you by your health care provider. Make sure you discuss any questions you have with your health care provider.   Document Released: 04/24/2005 Document Revised: 07/17/2011 Document Reviewed: 12/27/2012 Elsevier Interactive Patient Education [employer] Else[employer] Inc[employer]

## 2015-08-28 NOTE — ED Notes (Signed)
Pt unable to give urine sample at this time 

## 2015-08-28 NOTE — ED Notes (Signed)
Pt came to ED from home c/o fall. Pt has history of dementia, does not remember falling. Pt reports right shoulder pain. Pt is on hospice. Pt is a DNR.

## 2015-08-28 NOTE — ED Notes (Signed)
Talked to pts daughter to obtain medical history. Would like to be updated on pts status- Peggy [phone])

## 2015-08-28 NOTE — ED Notes (Signed)
Per Dr. Scotty CourtStafford, he discussed plan of care with pt's daughter who agrees pt should be discharged home and she will coordinate care with caregiver, neighbors, and hospice.

## 2015-08-28 NOTE — ED Notes (Signed)
Pt given water for PO challenge.  Pt offered applesauce but refused

## 2015-08-28 NOTE — ED Notes (Signed)
Tried calling daughter to obtain medical history. Unable to reach her.

## 2016-02-06 DEATH — deceased

## 2016-10-24 IMAGING — CR DG CHEST 2V
2 series · 2 of 2 positions shown · non-contrast
Comparison: None.

CLINICAL DATA: Altered mental status

EXAM:
CHEST  2 VIEW

[chest lat]
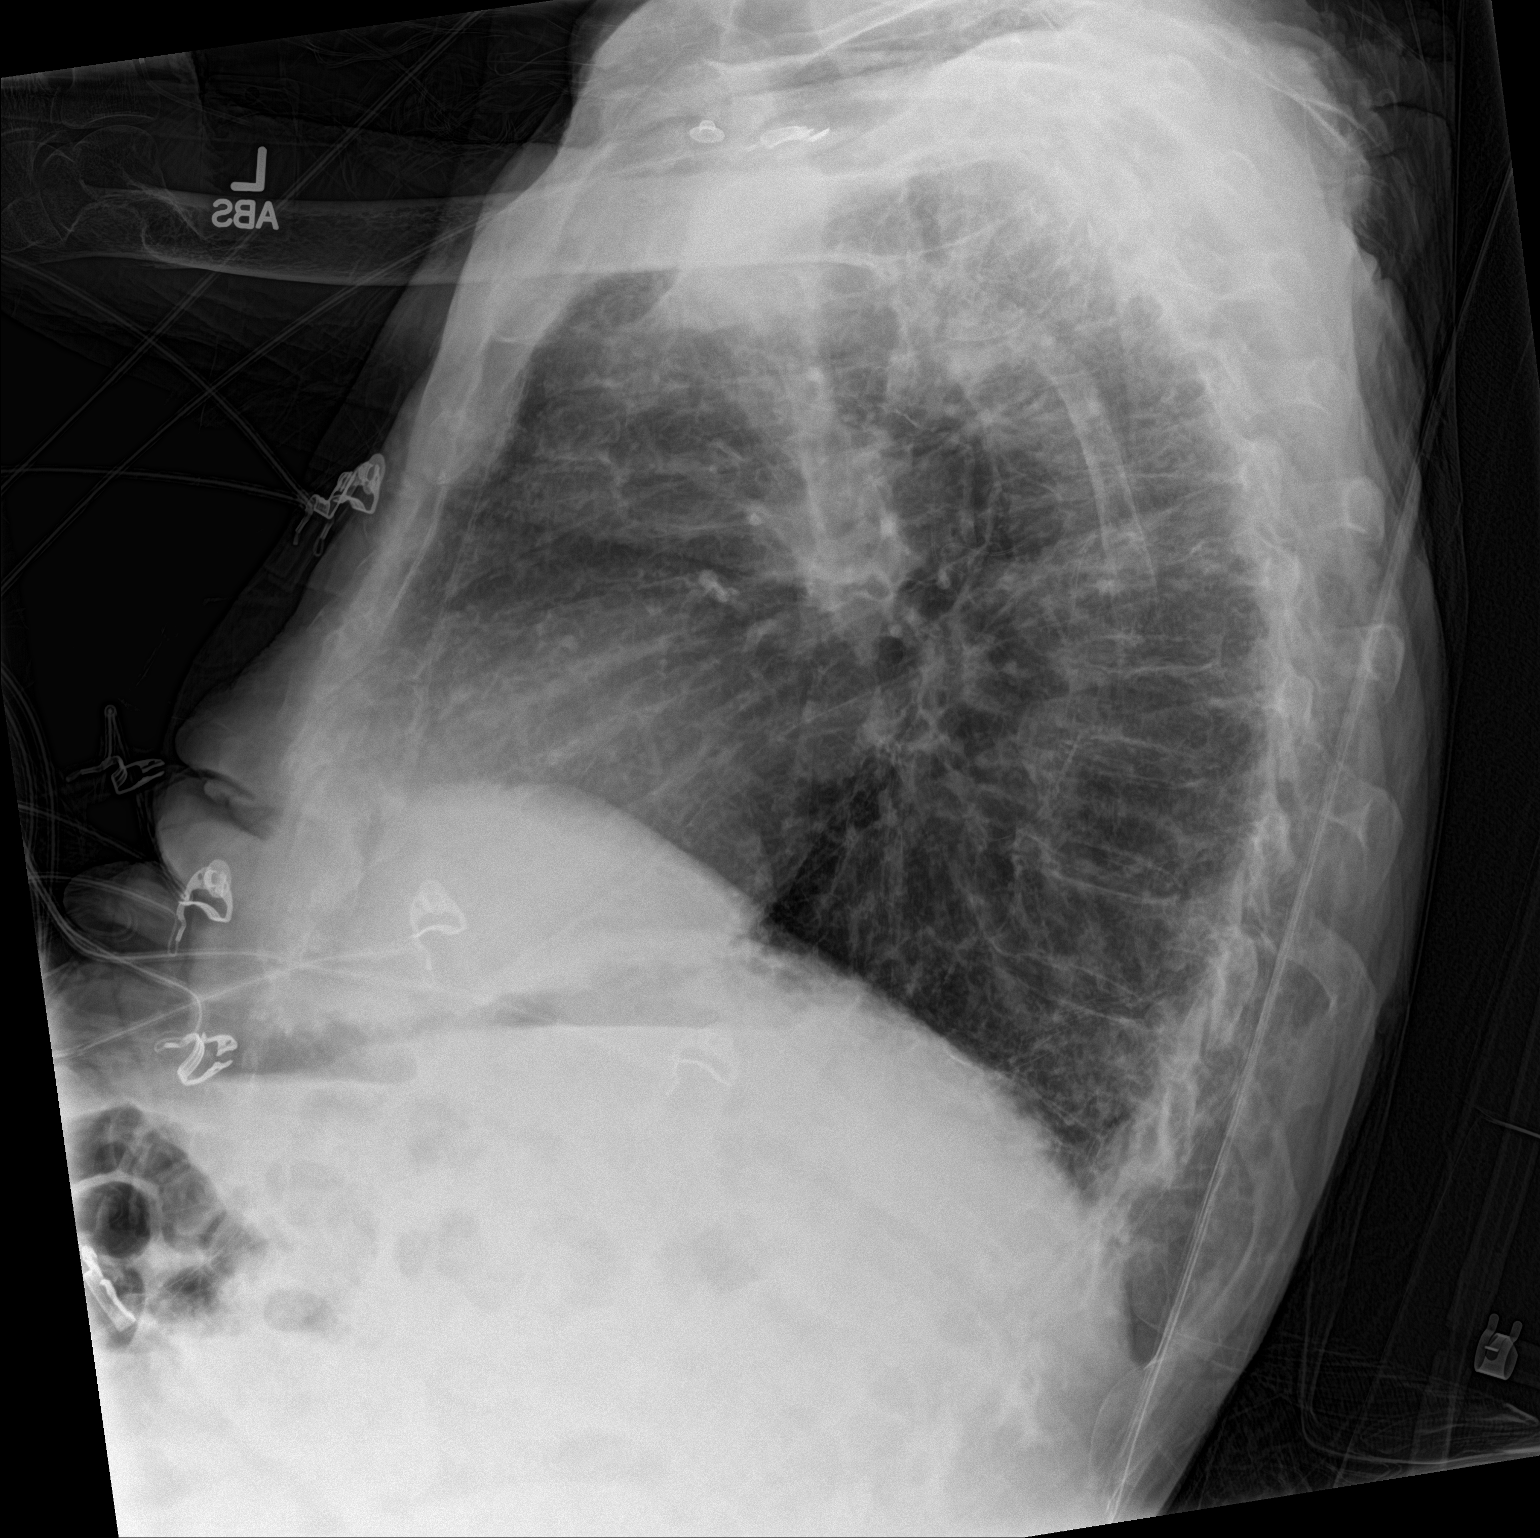

[chest ap]
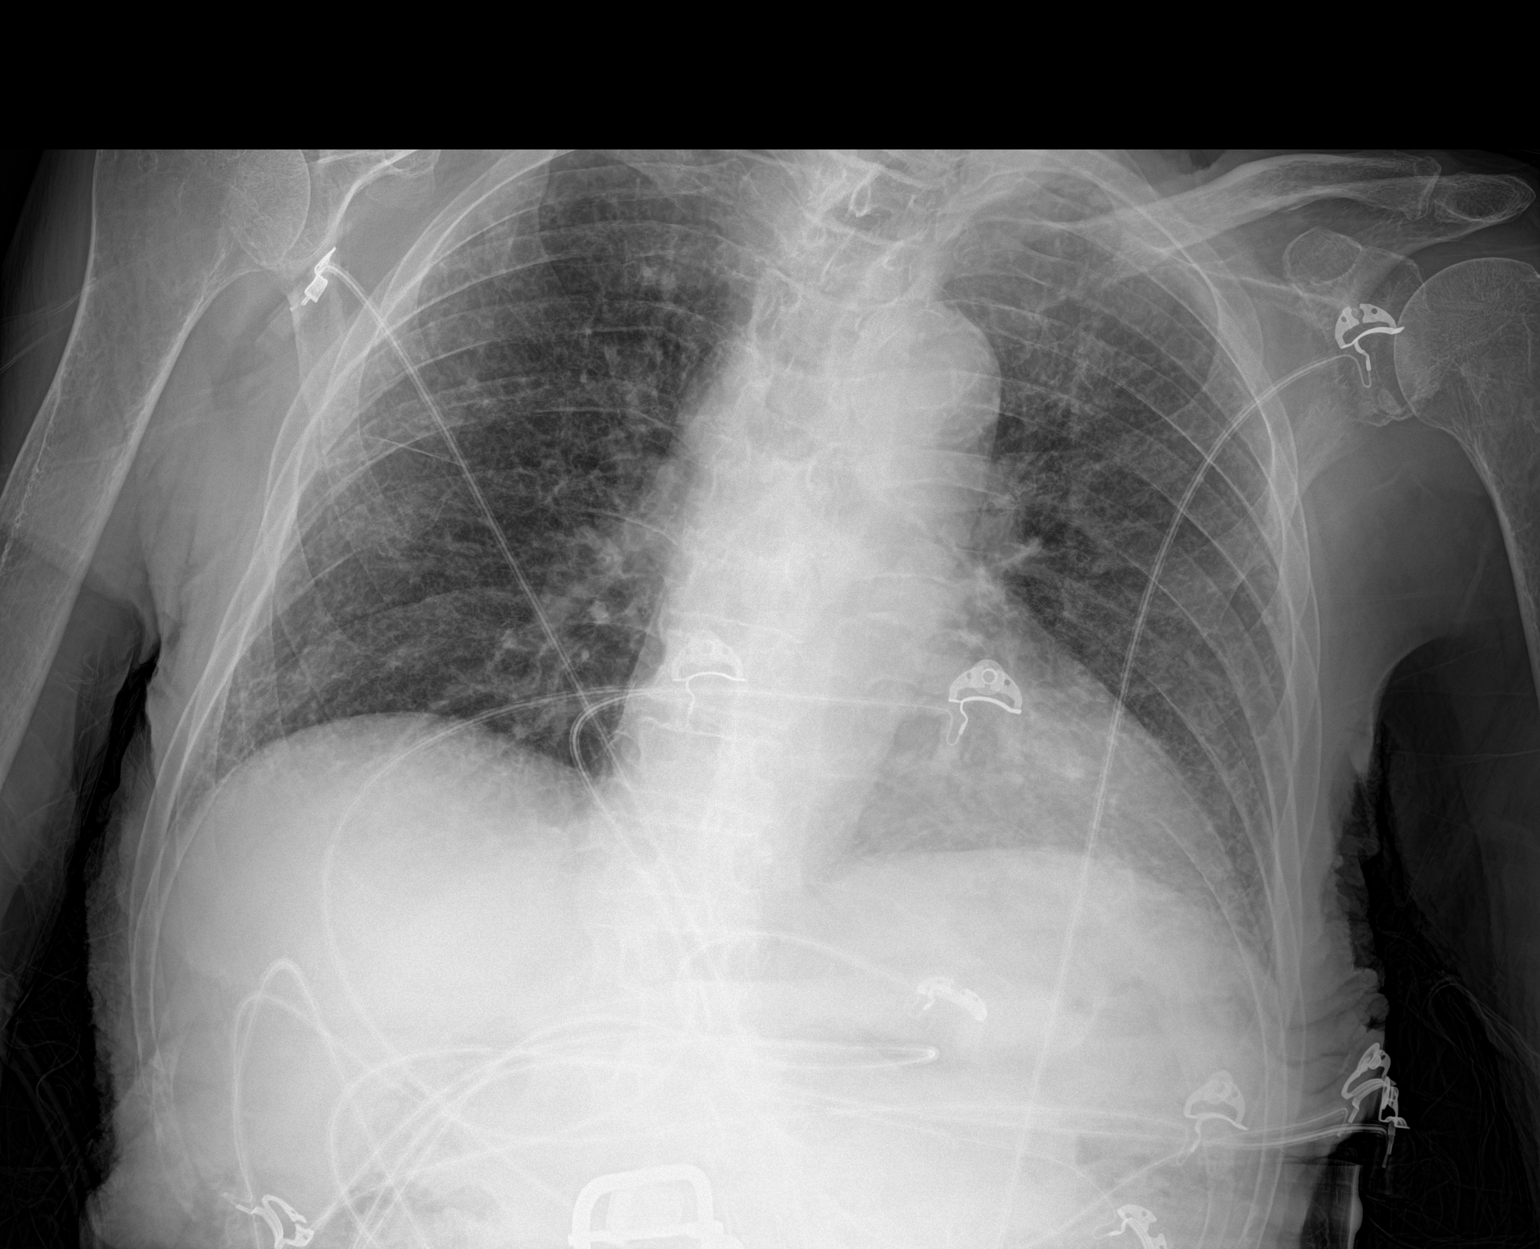

[2 of 2 positions shown; findings below may reference images not displayed]

FINDINGS: Interstitial coarsening which appears subpleural and is associated
with mildly low lung volumes. No gross honeycombing. There is no
edema, consolidation, effusion, or pneumothorax. Mild cardiomegaly.
Negative aortic and hilar contours.
IMPRESSION: No pneumonia or edema.

Suspect interstitial lung disease.

## 2016-10-24 IMAGING — CT CT HEAD W/O CM
1 series · 16 of 30 positions shown, 20 images · non-contrast
Comparison: None.

CLINICAL DATA: Patient with altered mental status.  Dizziness.

EXAM:
CT HEAD WITHOUT CONTRAST
TECHNIQUE: Contiguous axial images were obtained from the base of the skull
through the vertex without intravenous contrast.

[Series 2: head wo · axial · 0.43mm/px · z∈[-72,+63]mm · 16 of 34 slices shown, 20 images]
[im 2/34  brain]
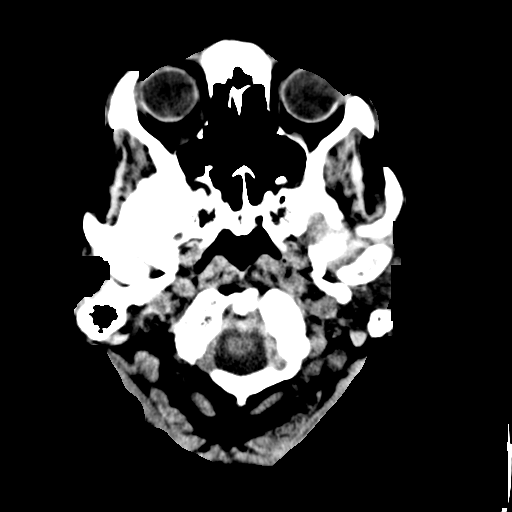
[im 2/34  bone]
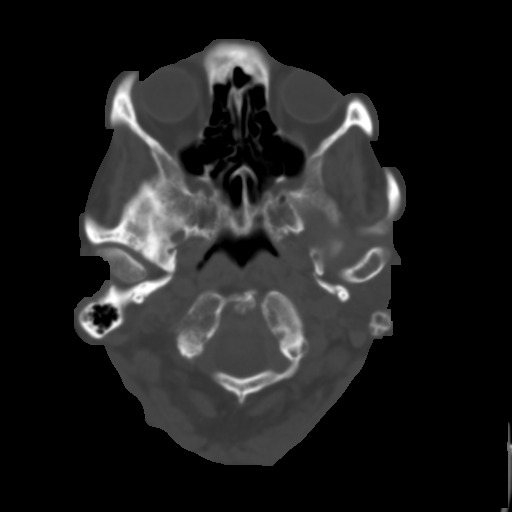
[im 4/34  brain]
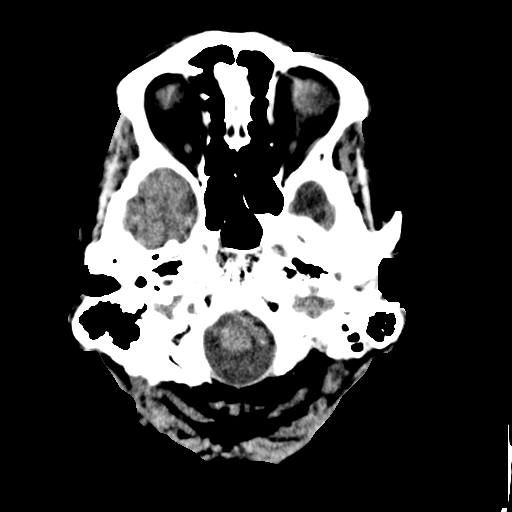
[im 6/34  brain]
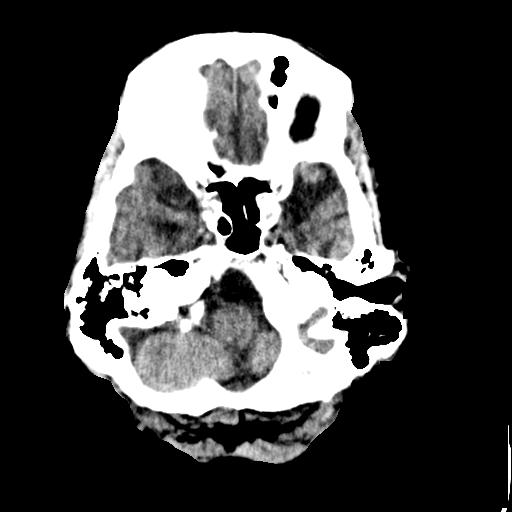
[im 8/34  brain]
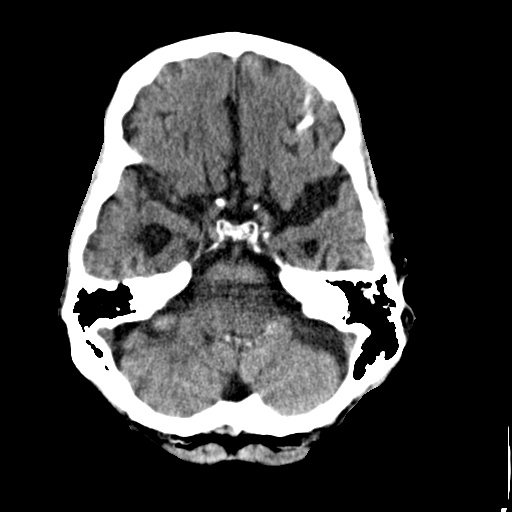
[im 10/34  brain]
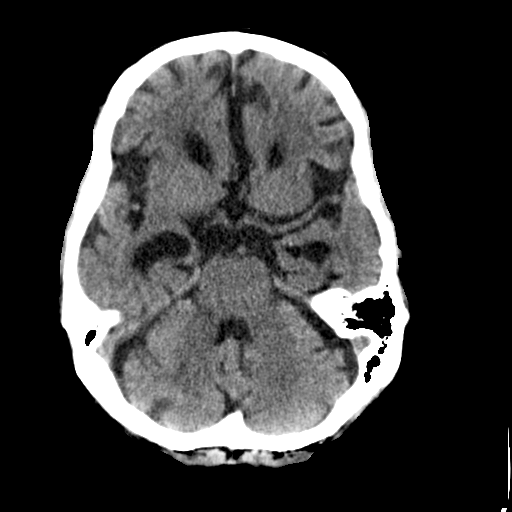
[im 10/34  bone]
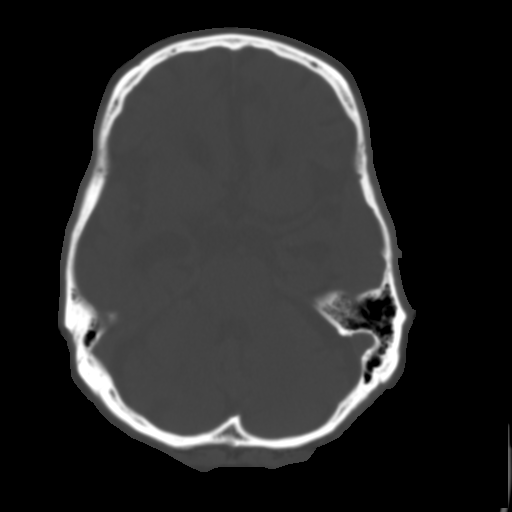
[im 12/34  brain]
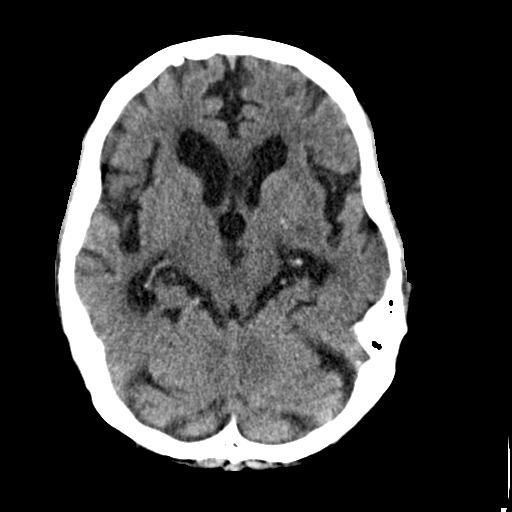
[im 14/34  brain]
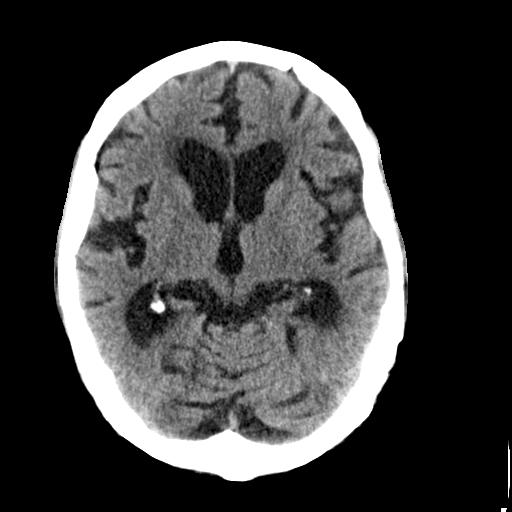
[im 16/34  brain]
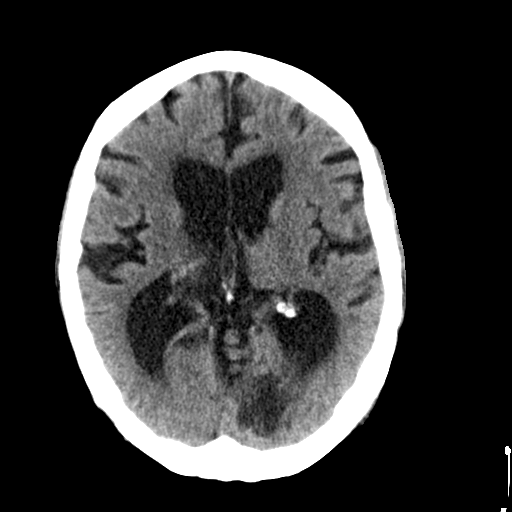
[im 18/34  brain]
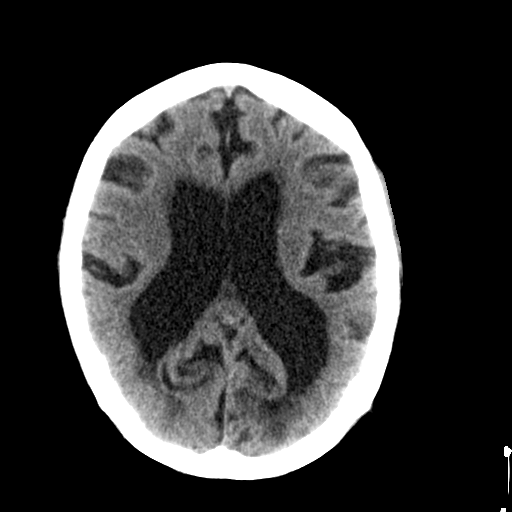
[im 18/34  bone]
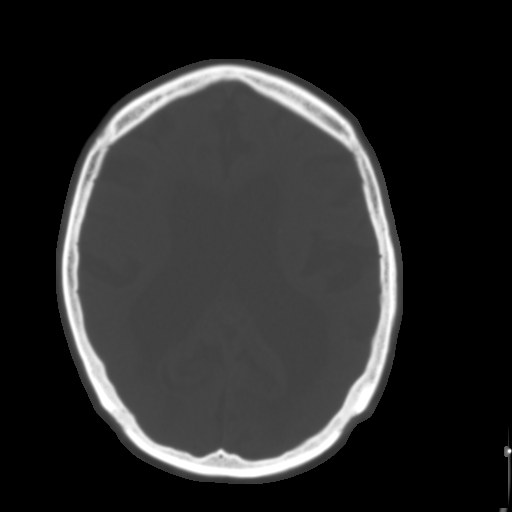
[im 20/34  brain]
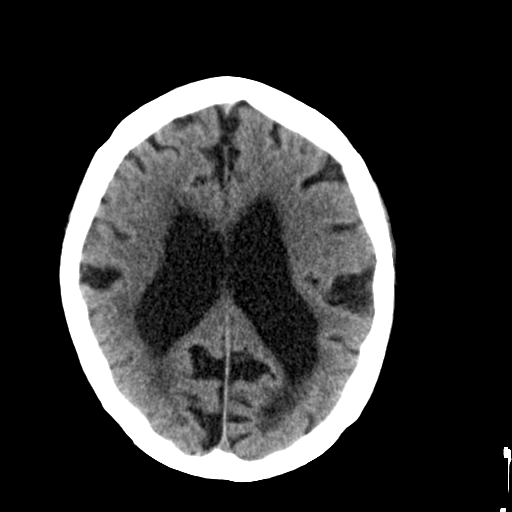
[im 22/34  brain]
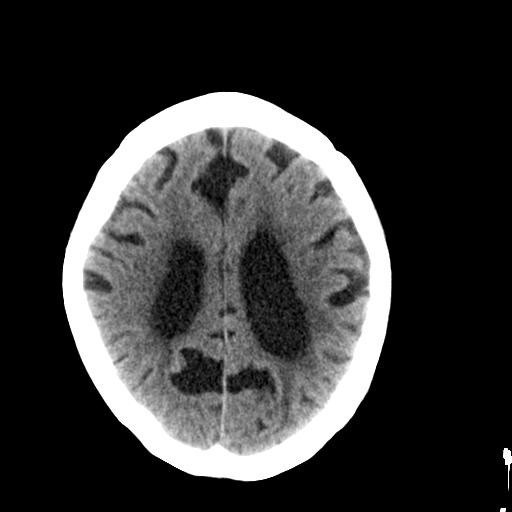
[im 24/34  brain]
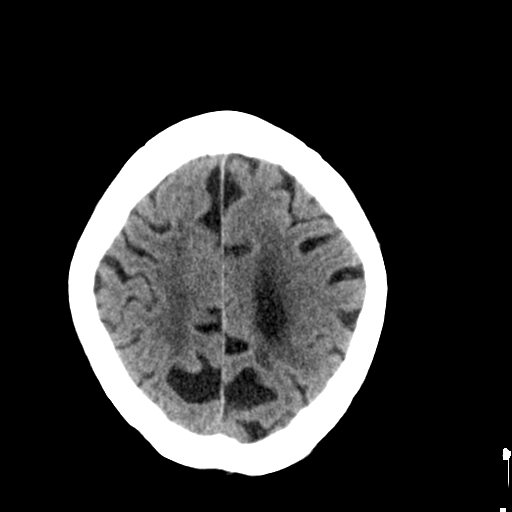
[im 26/34  brain]
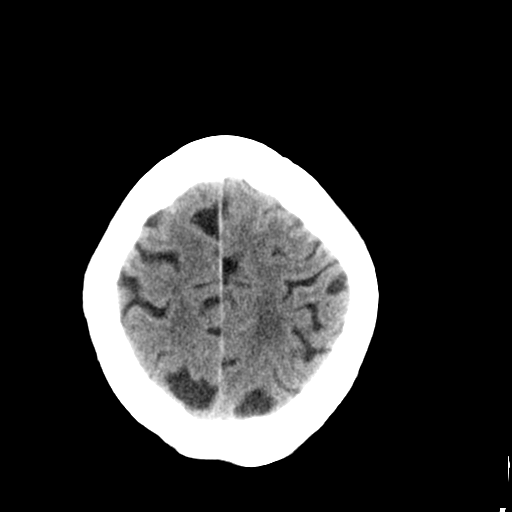
[im 26/34  bone]
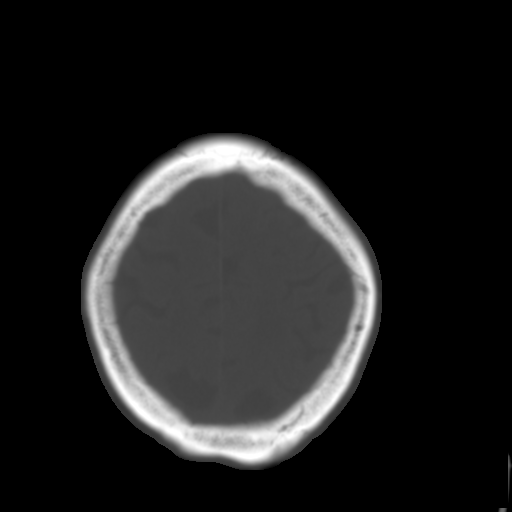
[im 28/34  brain]
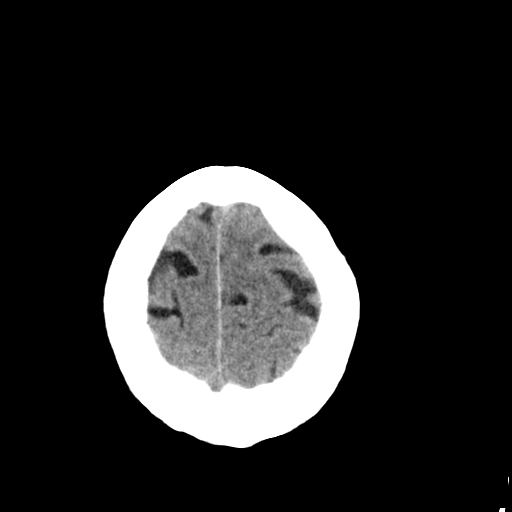
[im 30/34  brain]
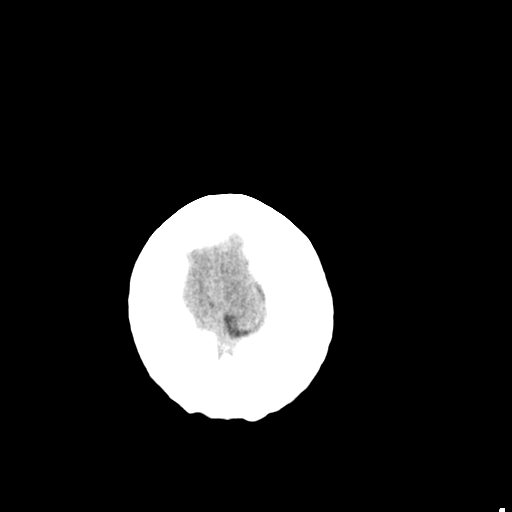
[im 32/34  brain]
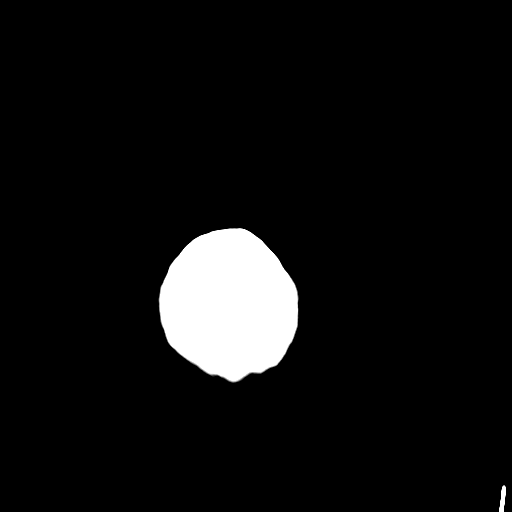

[16 of 30 positions shown; findings below may reference images not displayed]

FINDINGS: Ventricles and sulci are prominent compatible with atrophy.
Periventricular and subcortical white matter hypodensity compatible
with chronic small vessel ischemic changes. Left basal ganglia
calcifications. Focal area of hypodensity within the left occipital
lobe (image 16; series 2), most compatible with chronic infarct. No
evidence for acute intracranial hemorrhage or mass lesion. Calvarium
is intact. Paranasal sinuses are unremarkable. Mastoid air cells are
well aerated.
IMPRESSION: No acute intracranial process.

Likely chronic left occipital lobe infarct.

Cortical atrophy and chronic small vessel ischemic changes.

## 2016-10-28 IMAGING — US US RENAL
1 series · 14 of 25 positions shown · non-contrast
Comparison: None.

CLINICAL DATA: Acute renal failure.

EXAM:
RENAL / URINARY TRACT ULTRASOUND COMPLETE

[Series 1: us renal · 0.22mm/px · 14 of 51 slices shown]
[im 1/51]
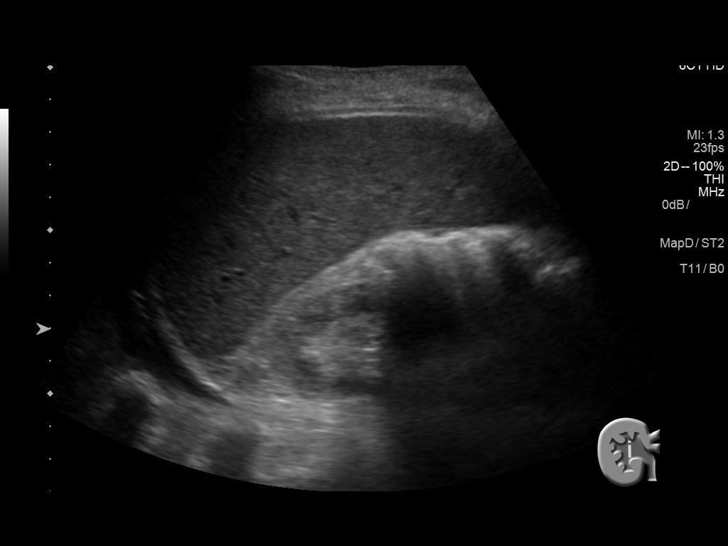
[im 5/51]
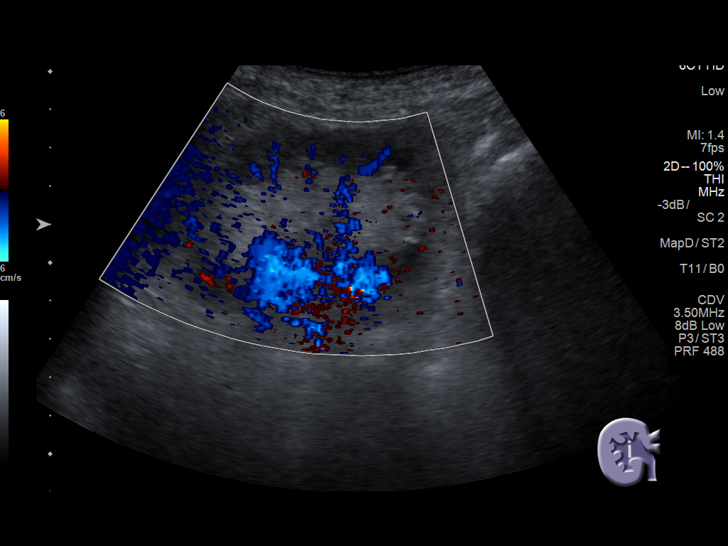
[im 9/51]
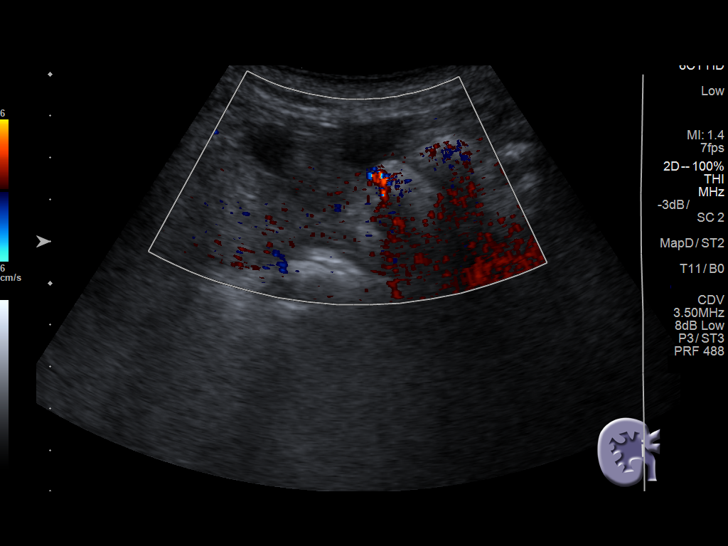
[im 13/51]
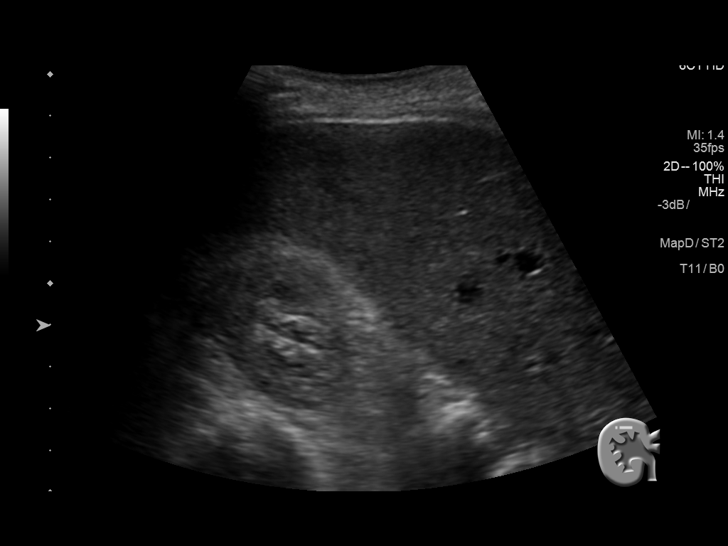
[im 17/51]
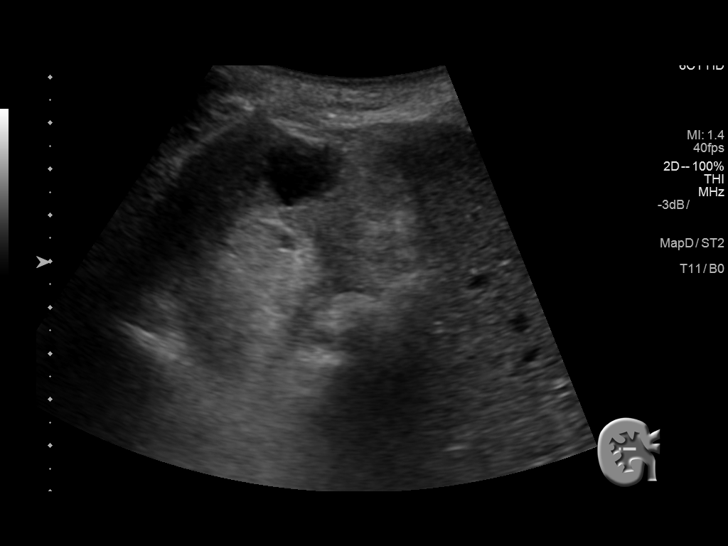
[im 19/51]
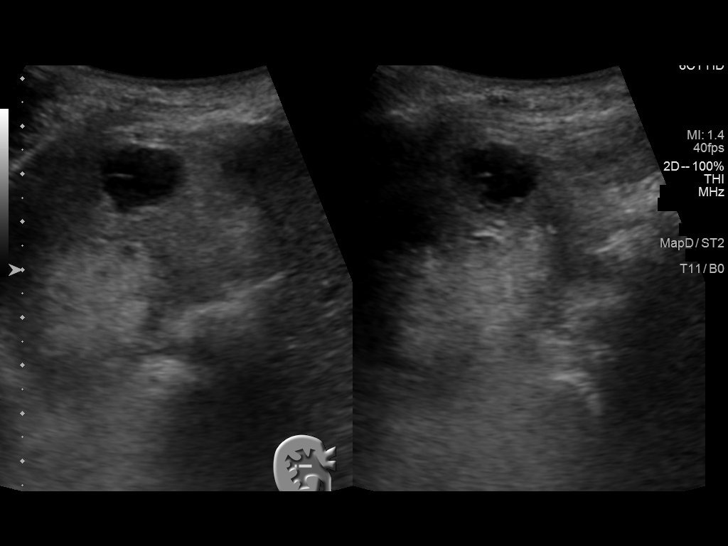
[im 23/51]
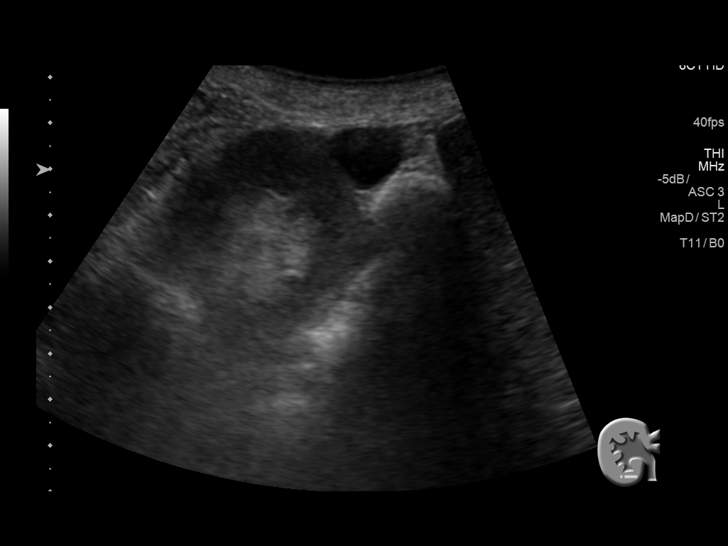
[im 28/51]
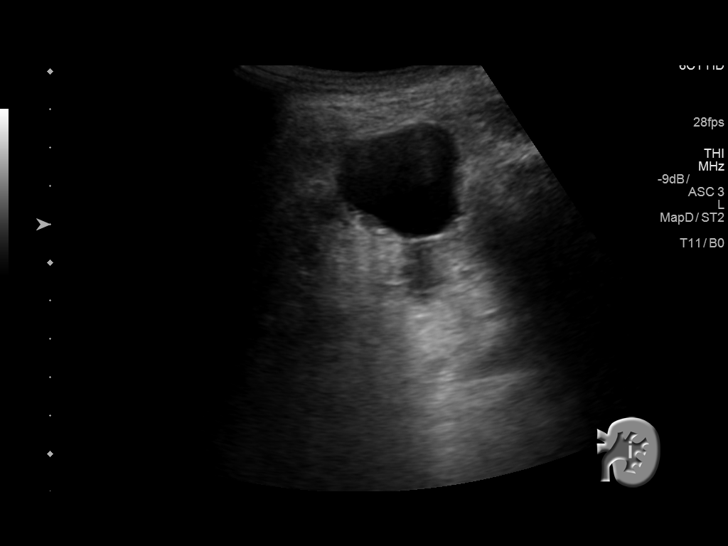
[im 32/51]
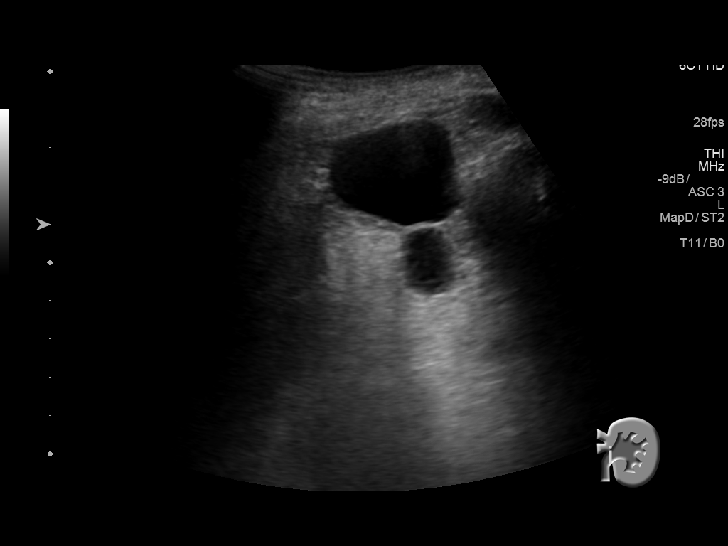
[im 34/51]
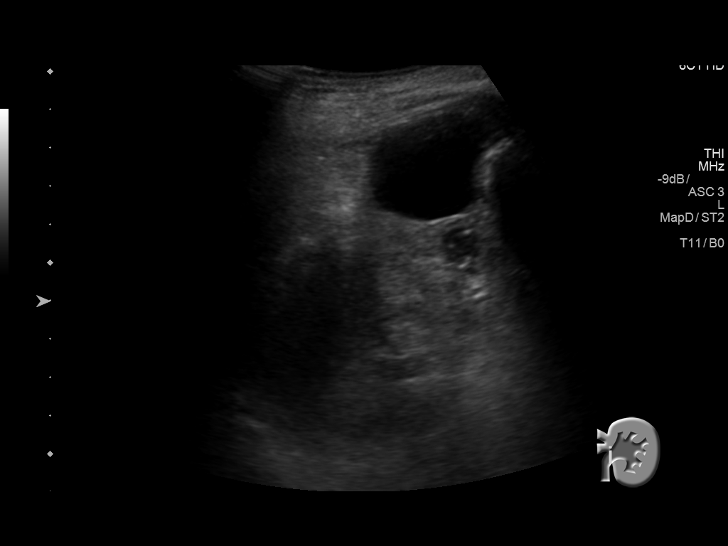
[im 38/51]
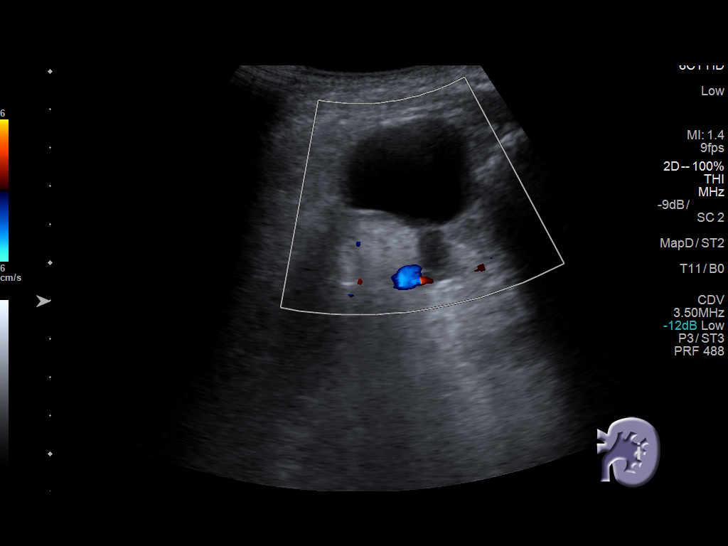
[im 42/51]
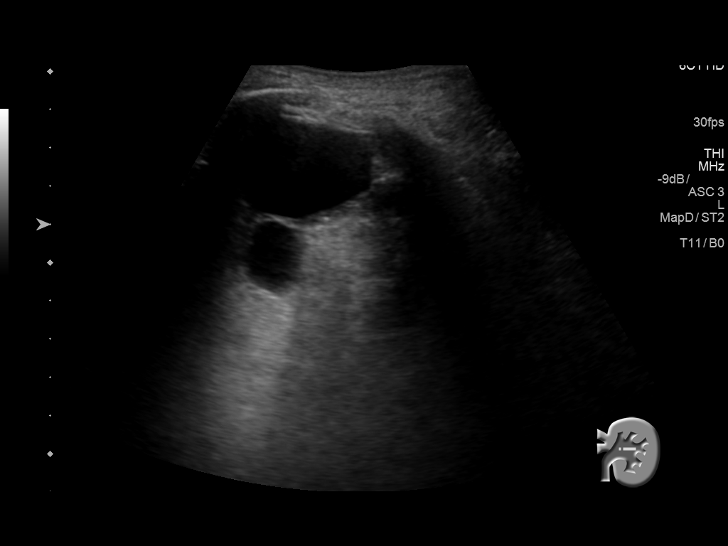
[im 46/51]
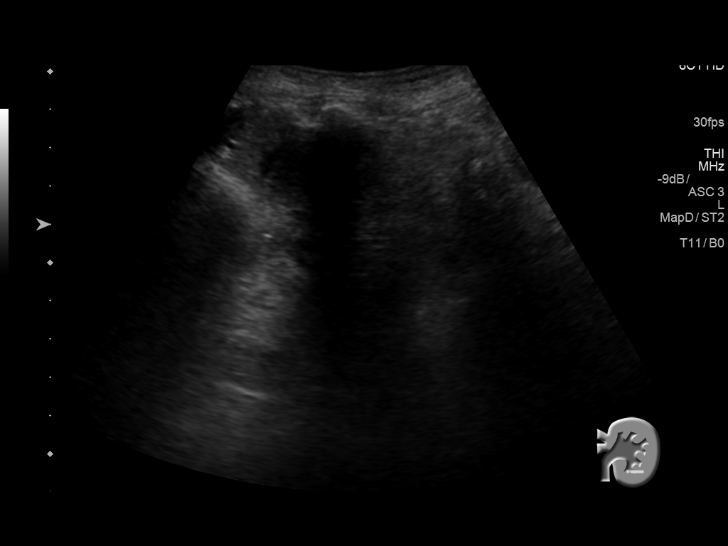
[im 51/51]
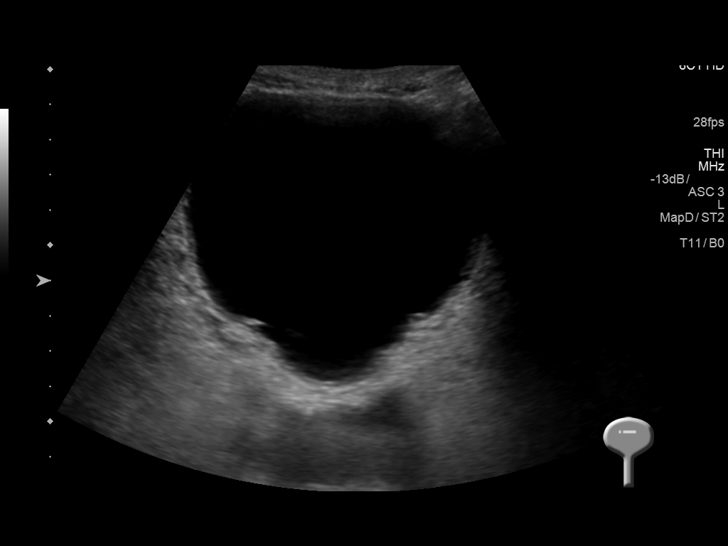

[14 of 25 positions shown; findings below may reference images not displayed]

FINDINGS: Right Kidney:

Length: 8.1 cm. Parenchymal echogenicity is within normal limits.
Anechoic lesions with increased through transmission measure up to
1.9 x 1.3 x 1.7 cm in the interpolar region. The largest lesion has
an internal septation, indicative of minimal complexity. No solid
lesion or hydronephrosis.

Left Kidney:

Length: 8.0 cm. Parenchymal echogenicity is within normal limits.
Anechoic lesions with increased through transmission measure up to
3.6 x 2.9 x 3.5 cm in the interpolar region. No solid lesion. No
hydronephrosis.

Bladder:

Appears normal for degree of bladder distention.

Other:

Right pleural effusion is incidentally noted.
IMPRESSION: 1. Bilateral renal cysts, 1 of which is minimally complex on the
right.
2. Right pleural effusion.
